# Patient Record
Sex: Male | Born: 1975 | Race: White | Hispanic: No | Marital: Married | Smoking: Former smoker
Health system: Southern US, Community
[De-identification: ages and names within clinical notes are randomized; demographics above are authoritative.]

## PROBLEM LIST (undated history)

## (undated) DIAGNOSIS — R519 Headache, unspecified: Secondary | ICD-10-CM

## (undated) DIAGNOSIS — J309 Allergic rhinitis, unspecified: Secondary | ICD-10-CM

## (undated) DIAGNOSIS — R51 Headache: Secondary | ICD-10-CM

## (undated) DIAGNOSIS — Z87898 Personal history of other specified conditions: Secondary | ICD-10-CM

## (undated) DIAGNOSIS — E039 Hypothyroidism, unspecified: Secondary | ICD-10-CM

## (undated) DIAGNOSIS — Z8679 Personal history of other diseases of the circulatory system: Secondary | ICD-10-CM

## (undated) DIAGNOSIS — I10 Essential (primary) hypertension: Secondary | ICD-10-CM

## (undated) DIAGNOSIS — K219 Gastro-esophageal reflux disease without esophagitis: Secondary | ICD-10-CM

## (undated) DIAGNOSIS — G4730 Sleep apnea, unspecified: Secondary | ICD-10-CM

## (undated) DIAGNOSIS — F419 Anxiety disorder, unspecified: Secondary | ICD-10-CM

## (undated) HISTORY — DX: Headache: R51

## (undated) HISTORY — DX: Sleep apnea, unspecified: G47.30

## (undated) HISTORY — DX: Personal history of other diseases of the circulatory system: Z86.79

## (undated) HISTORY — DX: Hypothyroidism, unspecified: E03.9

## (undated) HISTORY — PX: NASAL SINUS SURGERY: SHX719

## (undated) HISTORY — DX: Personal history of other specified conditions: Z87.898

## (undated) HISTORY — DX: Allergic rhinitis, unspecified: J30.9

## (undated) HISTORY — PX: OTHER SURGICAL HISTORY: SHX169

## (undated) HISTORY — DX: Anxiety disorder, unspecified: F41.9

## (undated) HISTORY — DX: Essential (primary) hypertension: I10

## (undated) HISTORY — DX: Headache, unspecified: R51.9

## (undated) HISTORY — DX: Gastro-esophageal reflux disease without esophagitis: K21.9

## (undated) HISTORY — PX: COLONOSCOPY: SHX174

---

## 2012-06-23 ENCOUNTER — Other Ambulatory Visit (HOSPITAL_COMMUNITY): Payer: Self-pay | Admitting: Cardiology

## 2012-06-23 DIAGNOSIS — R002 Palpitations: Secondary | ICD-10-CM

## 2012-06-23 DIAGNOSIS — R0602 Shortness of breath: Secondary | ICD-10-CM

## 2012-06-26 ENCOUNTER — Ambulatory Visit (HOSPITAL_COMMUNITY)
Admission: RE | Admit: 2012-06-26 | Discharge: 2012-06-26 | Disposition: A | Discharge status: Home / Self Care | Payer: BC Managed Care – PPO | Source: Ambulatory Visit | Attending: Cardiology | Admitting: Cardiology

## 2012-06-26 DIAGNOSIS — I059 Rheumatic mitral valve disease, unspecified: Secondary | ICD-10-CM | POA: Insufficient documentation

## 2012-06-26 DIAGNOSIS — R002 Palpitations: Secondary | ICD-10-CM | POA: Insufficient documentation

## 2012-06-26 DIAGNOSIS — R0602 Shortness of breath: Secondary | ICD-10-CM | POA: Insufficient documentation

## 2012-06-26 DIAGNOSIS — I10 Essential (primary) hypertension: Secondary | ICD-10-CM | POA: Insufficient documentation

## 2012-06-26 DIAGNOSIS — I379 Nonrheumatic pulmonary valve disorder, unspecified: Secondary | ICD-10-CM | POA: Insufficient documentation

## 2012-06-26 DIAGNOSIS — I517 Cardiomegaly: Secondary | ICD-10-CM | POA: Insufficient documentation

## 2012-06-26 DIAGNOSIS — I079 Rheumatic tricuspid valve disease, unspecified: Secondary | ICD-10-CM | POA: Insufficient documentation

## 2012-06-26 NOTE — Progress Notes (Signed)
Matthew Lawrence   2D echo completed 06/26/2012.   Cindy Precious Gilchrest, RDCS  

## 2013-10-01 ENCOUNTER — Encounter (HOSPITAL_COMMUNITY): Payer: Self-pay | Admitting: Emergency Medicine

## 2013-10-01 ENCOUNTER — Emergency Department (HOSPITAL_COMMUNITY)
Admission: EM | Admit: 2013-10-01 | Discharge: 2013-10-01 | Disposition: A | Discharge status: Home / Self Care | Payer: No Typology Code available for payment source | Attending: Emergency Medicine | Admitting: Emergency Medicine

## 2013-10-01 DIAGNOSIS — Y9389 Activity, other specified: Secondary | ICD-10-CM | POA: Insufficient documentation

## 2013-10-01 DIAGNOSIS — F17200 Nicotine dependence, unspecified, uncomplicated: Secondary | ICD-10-CM | POA: Insufficient documentation

## 2013-10-01 DIAGNOSIS — S199XXA Unspecified injury of neck, initial encounter: Secondary | ICD-10-CM

## 2013-10-01 DIAGNOSIS — M549 Dorsalgia, unspecified: Secondary | ICD-10-CM

## 2013-10-01 DIAGNOSIS — Y92410 Unspecified street and highway as the place of occurrence of the external cause: Secondary | ICD-10-CM | POA: Insufficient documentation

## 2013-10-01 DIAGNOSIS — IMO0002 Reserved for concepts with insufficient information to code with codable children: Secondary | ICD-10-CM | POA: Insufficient documentation

## 2013-10-01 DIAGNOSIS — S0993XA Unspecified injury of face, initial encounter: Secondary | ICD-10-CM | POA: Insufficient documentation

## 2013-10-01 MED ORDER — METHOCARBAMOL 500 MG PO TABS: 500.0000 mg | ORAL_TABLET | Freq: Two times a day (BID) | ORAL | Status: DC

## 2013-10-01 MED ORDER — OXYCODONE-ACETAMINOPHEN 5-325 MG PO TABS: 1.0000 | ORAL_TABLET | ORAL | Status: DC | PRN
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
## 2013-10-01 NOTE — Discharge Instructions (Signed)
Take the prescribed medication as directed.  Recommend taking a dose before bed tonight. You will continue to be sore for the next several days which is expected. Return to the ED for new or worsening symptoms.

## 2013-10-01 NOTE — ED Provider Notes (Signed)
Medical screening examination/treatment/procedure(s) were performed by non-physician practitioner and as supervising physician I was immediately available for consultation/collaboration.   EKG Interpretation None       Martha K Linker, MD 10/01/13 1135 

## 2014-05-11 ENCOUNTER — Other Ambulatory Visit: Payer: Self-pay | Admitting: Family Medicine

## 2014-05-11 DIAGNOSIS — R1011 Right upper quadrant pain: Secondary | ICD-10-CM

## 2014-05-11 DIAGNOSIS — E0789 Other specified disorders of thyroid: Secondary | ICD-10-CM

## 2014-05-18 ENCOUNTER — Ambulatory Visit
Admission: RE | Admit: 2014-05-18 | Discharge: 2014-05-18 | Disposition: A | Discharge status: Home / Self Care | Payer: 59 | Source: Ambulatory Visit | Attending: Family Medicine | Admitting: Family Medicine

## 2014-05-18 ENCOUNTER — Ambulatory Visit
Admission: RE | Admit: 2014-05-18 | Discharge: 2014-05-18 | Disposition: A | Discharge status: Home / Self Care | Payer: Self-pay | Source: Ambulatory Visit | Attending: Family Medicine | Admitting: Family Medicine

## 2014-05-18 DIAGNOSIS — E0789 Other specified disorders of thyroid: Secondary | ICD-10-CM

## 2014-05-18 DIAGNOSIS — R1011 Right upper quadrant pain: Secondary | ICD-10-CM

## 2014-05-27 ENCOUNTER — Other Ambulatory Visit: Payer: Self-pay | Admitting: Family Medicine

## 2014-05-27 DIAGNOSIS — R9389 Abnormal findings on diagnostic imaging of other specified body structures: Secondary | ICD-10-CM

## 2014-06-25 ENCOUNTER — Other Ambulatory Visit: Payer: 59

## 2014-07-11 ENCOUNTER — Ambulatory Visit
Admission: RE | Admit: 2014-07-11 | Discharge: 2014-07-11 | Disposition: A | Discharge status: Home / Self Care | Payer: 59 | Source: Ambulatory Visit | Attending: Family Medicine | Admitting: Family Medicine

## 2014-07-11 DIAGNOSIS — R9389 Abnormal findings on diagnostic imaging of other specified body structures: Secondary | ICD-10-CM

## 2014-07-11 MED ORDER — GADOBENATE DIMEGLUMINE 529 MG/ML IV SOLN
15.0000 mL | Freq: Once | INTRAVENOUS | Status: AC | PRN
  Administered 2014-07-11: 15 mL via INTRAVENOUS

## 2014-09-02 ENCOUNTER — Encounter: Payer: Self-pay | Admitting: *Deleted

## 2014-10-04 ENCOUNTER — Encounter: Payer: Self-pay | Admitting: Cardiology

## 2015-06-28 ENCOUNTER — Other Ambulatory Visit: Payer: Self-pay | Admitting: Neurology

## 2015-06-28 ENCOUNTER — Encounter: Payer: Self-pay | Admitting: Neurology

## 2015-06-28 ENCOUNTER — Ambulatory Visit (INDEPENDENT_AMBULATORY_CARE_PROVIDER_SITE_OTHER): Payer: Commercial Managed Care - HMO | Admitting: Neurology

## 2015-06-28 VITALS — BP 126/81 | HR 84 | Ht 67.25 in | Wt 182.0 lb

## 2015-06-28 DIAGNOSIS — M2669 Other specified disorders of temporomandibular joint: Secondary | ICD-10-CM

## 2015-06-28 DIAGNOSIS — R51 Headache: Secondary | ICD-10-CM | POA: Diagnosis not present

## 2015-06-28 DIAGNOSIS — M26649 Arthritis of unspecified temporomandibular joint: Secondary | ICD-10-CM | POA: Insufficient documentation

## 2015-06-28 DIAGNOSIS — R519 Headache, unspecified: Secondary | ICD-10-CM | POA: Insufficient documentation

## 2015-06-28 MED ORDER — GABAPENTIN 300 MG PO CAPS: 300.0000 mg | ORAL_CAPSULE | Freq: Three times a day (TID) | ORAL | Status: DC

## 2015-06-28 MED ORDER — MELOXICAM 7.5 MG PO TABS: 7.5000 mg | ORAL_TABLET | Freq: Two times a day (BID) | ORAL | Status: AC | PRN

## 2015-06-28 NOTE — Progress Notes (Signed)
PATIENT: Matthew Lawrence DOB: 04/22/1975  Chief Complaint  Patient presents with  . Possible Trigeminal Neuralgia    He is here with his fiance, Matthew Lawrence.  States he has experienced intermittent left-sided facial pain over the last several years.  His most recent flare-up has been the worst to date.  He is currently using cyclobenzaprine 10mg  at bedtime, which helps him sleep.  He also uses ibuprofen as needed.     HISTORICAL  Matthew Lawrence is a 40 years old right-handed male, accompanied by his fiance Matthew Lawrence, seen in refer by his primary care physician  Matthew Lawrence 40 evaluation of left facial pain in Jun 28 2015  He noticed intermittent left TMG/left facial pain since 2010, it only happened once while, but since April 2017, he has frequent flare of severe left TMG left facial pain, he described left lower and upper molar area deep achy sensation, then he also noticed left external ear canal sharp radiating pain, worsening pain when he opened his mouth, he also noticed left the skull hypersensitivity to touch, left facial swelling, severe pain can last 10-30 minutes, during episode, he often have to avoid talking open his mouth to ease the pain,  He did have a history of chickenpox in the past, there was no rash broke out, no hearing loss, he had intermittent bilateral ear high pitched tinnitus, was evaluated by ENT in the past, was diagnosed with high-frequency hearing loss.  He reported a history of anaphylactic reaction with gadolinium MRI contrast in May 2016  REVIEW OF SYSTEMS: Full 14 system review of systems performed and notable only for Ringing ears, snoring, joint pain, joint swelling, achy muscles, allergy, runny nose, skin sensitivity, headaches, snoring   ALLERGIES: Allergies  Allergen Reactions  . Gadolinium Derivatives Nausea Only, Other (See Comments) and Cough    Blotchy behind ears, sneezing and extremely stuffy. Chest pain. Given 50 mg benadryl by mouth and  observed.     HOME MEDICATIONS: Current Outpatient Prescriptions  Medication Sig Dispense Refill  . clonazePAM (KLONOPIN) 0.5 MG tablet Take 0.5 mg by mouth as needed for anxiety.    . cyclobenzaprine (FLEXERIL) 10 MG tablet TK 1 T PO QHS PRN FOR SPASMS  0  . ibuprofen (ADVIL,MOTRIN) 200 MG tablet Take 400 mg by mouth every 6 (six) hours as needed for mild pain or moderate pain.    Marland Kitchen. levothyroxine (SYNTHROID, LEVOTHROID) 100 MCG tablet TK 1 T PO  QD  4  . loratadine (CLARITIN) 10 MG tablet Take 10 mg by mouth daily as needed.      No current facility-administered medications for this visit.    PAST MEDICAL HISTORY: Past Medical History  Diagnosis Date  . H/O chest pain     Cardionet, Palps  . H/O sinus tachycardia     Cardionet  . Hypertension   . Hypothyroidism   . GERD (gastroesophageal reflux disease)   . Sleep apnea     He does not use a CPAP machine.  . Allergic rhinitis   . Anxiety   . Facial pain     left-side    PAST SURGICAL HISTORY: Past Surgical History  Procedure Laterality Date  . None      FAMILY HISTORY: Family History  Problem Relation Age of Onset  . Cancer Maternal Grandfather   . Emphysema Paternal Grandfather   . Healthy Mother   . Healthy Father     SOCIAL HISTORY:  Social History   Social History  . Marital  Status: Married    Spouse Name: N/A  . Number of Children: 1  . Years of Education: Bachelors+   Occupational History  . [employer]    Social History Main Topics  . Smoking status: Current Every Day Smoker    Types: E-cigarettes  . Smokeless tobacco: Not on file     Comment: Quit smoking cigarettes 10+ years ago.  . Alcohol Use: 0.0 oz/week    0 Standard drinks or equivalent per week     Comment: Occasional use.  . Drug Use: 1.00 per week    Special: Marijuana     Comment: Occasional use.  Marland Kitchen Sexual Activity: Not on file   Other Topics Concern  . Not on file   Social History Narrative   Lives at home with  fiance, Matthew Lawrence.   Right-handed.   2 cups caffeine use.     PHYSICAL EXAM   Filed Vitals:   06/28/15 1152  BP: 126/81  Pulse: 84  Height: 5' 7.25" (1.708 m)  Weight: 182 lb (82.555 kg)    Not recorded      Body mass index is 28.3 kg/(m^2).  PHYSICAL EXAMNIATION:  Gen: NAD, conversant, well nourised, obese, well groomed                     Cardiovascular: Regular rate rhythm, no peripheral edema, warm, nontender. Eyes: Conjunctivae clear without exudates or hemorrhage Neck: Supple, no carotid bruise. Pulmonary: Clear to auscultation bilaterally  Musculoskeletal: Tenderness of left TMJ joint with mouth opening and closure,  NEUROLOGICAL EXAM:  MENTAL STATUS: Speech:    Speech is normal; fluent and spontaneous with normal comprehension.  Cognition:     Orientation to time, place and person     Normal recent and remote memory     Normal Attention span and concentration     Normal Language, naming, repeating,spontaneous speech     Fund of knowledge   CRANIAL NERVES: CN II: Visual fields are full to confrontation. Fundoscopic exam is normal with sharp discs and no vascular changes. Pupils are round equal and briskly reactive to light. CN III, IV, VI: extraocular movement are normal. No ptosis. CN V: Facial sensation is intact to pinprick in all 3 divisions bilaterally. Corneal responses are intact.  CN VII: Face is symmetric with normal eye closure and smile. CN VIII: Hearing is normal to rubbing fingers CN IX, X: Palate elevates symmetrically. Phonation is normal. CN XI: Head turning and shoulder shrug are intact CN XII: Tongue is midline with normal movements and no atrophy.  MOTOR: There is no pronator drift of out-stretched arms. Muscle bulk and tone are normal. Muscle strength is normal.  REFLEXES: Reflexes are 2+ and symmetric at the biceps, triceps, knees, and ankles. Plantar responses are flexor.  SENSORY: Intact to light touch, pinprick, positional  sensation and vibratory sensation are intact in fingers and toes.  COORDINATION: Rapid alternating movements and fine finger movements are intact. There is no dysmetria on finger-to-nose and heel-knee-shin.    GAIT/STANCE: Posture is normal. Gait is steady with normal steps, base, arm swing, and turning. Heel and toe walking are normal. Tandem gait is normal.  Romberg is absent.   DIAGNOSTIC DATA (LABS, IMAGING, TESTING) - I reviewed patient records, labs, notes, testing and imaging myself where available.   ASSESSMENT AND PLAN  Matthew Lawrence is a 40 y.o. 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 Ph.D.  [facility] [address] [place], [place] [address] Ph: [phone] Fax: [phone]  CC: Matthew Has, MD

## 2016-12-21 IMAGING — US US SOFT TISSUE HEAD/NECK
1 series · 14 of 25 positions shown · non-contrast
Comparison: None.

CLINICAL DATA: Evaluate thyroid fullness.

EXAM:
THYROID ULTRASOUND
TECHNIQUE: Ultrasound examination of the thyroid gland and adjacent soft
tissues was performed.

[Series 1: us soft tissue head/neck · 0.05mm/px · 14 of 48 slices shown]
[im 1/48]
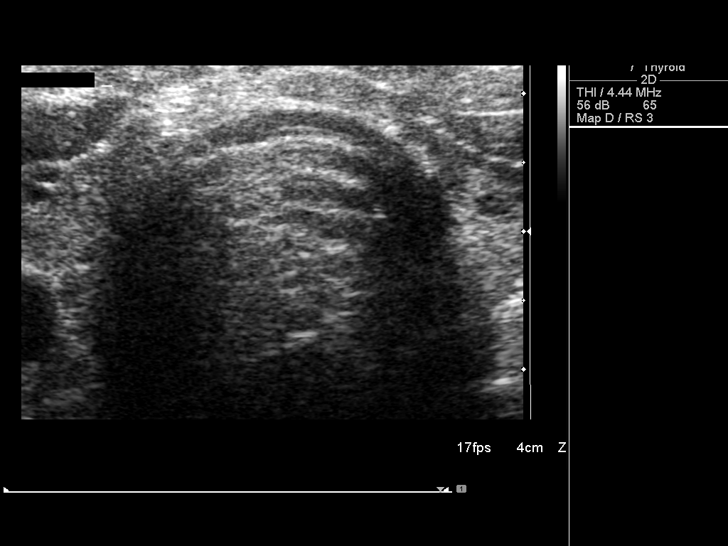
[im 4/48]
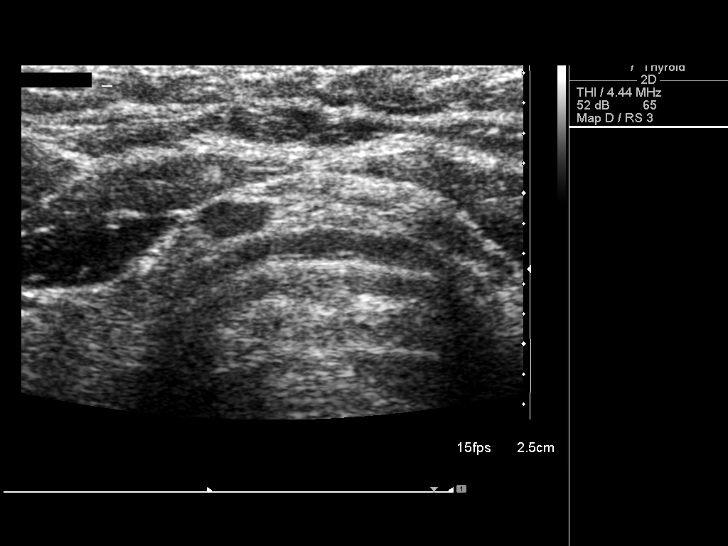
[im 8/48]
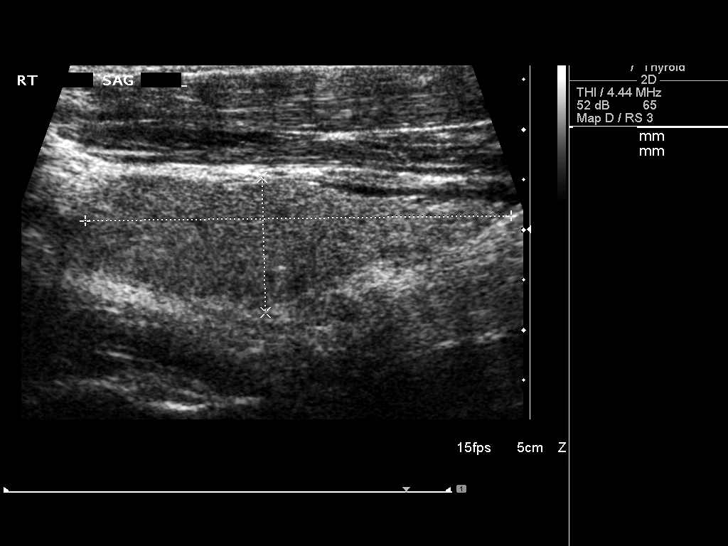
[im 12/48]
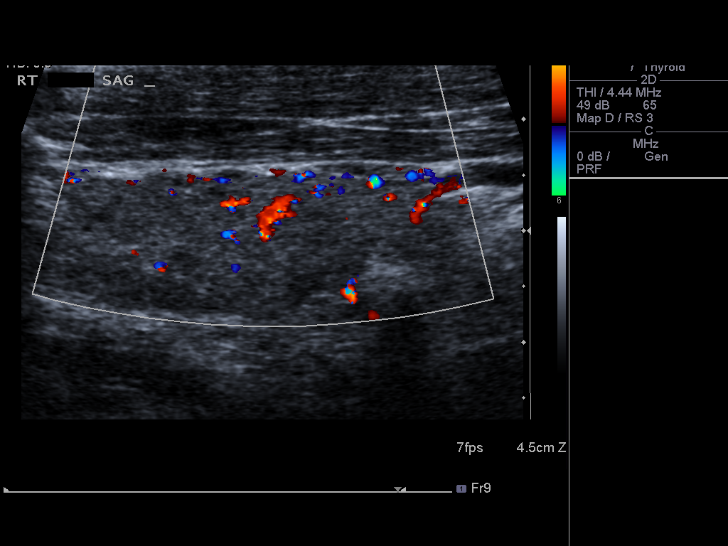
[im 16/48]
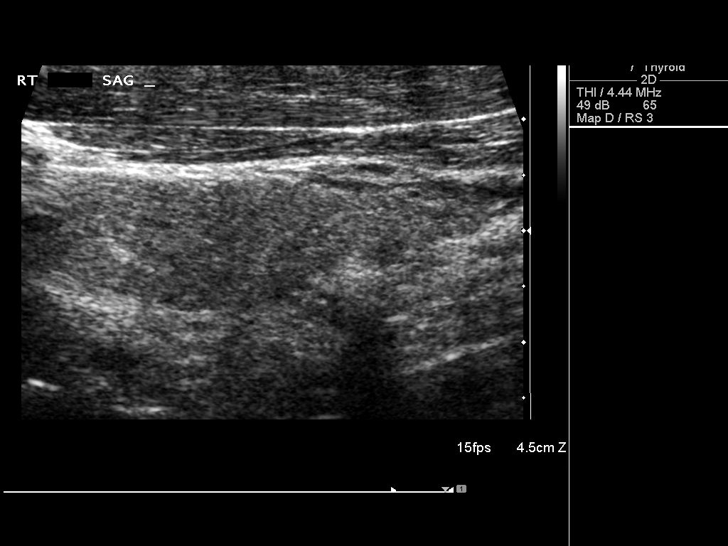
[im 18/48]
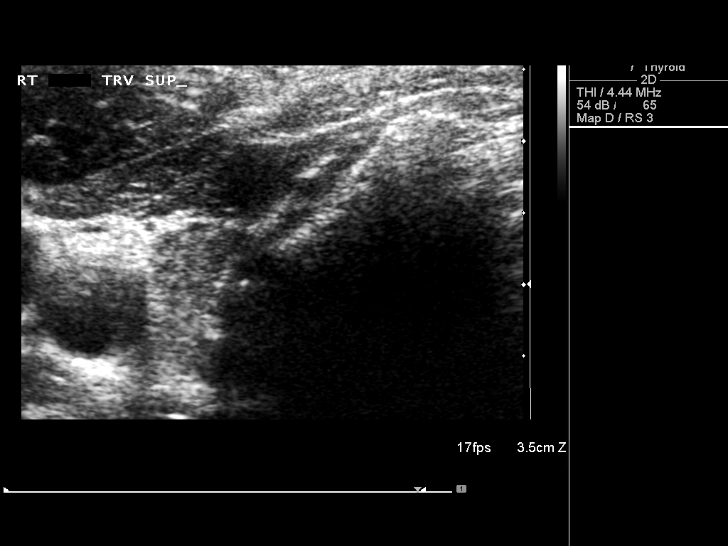
[im 22/48]
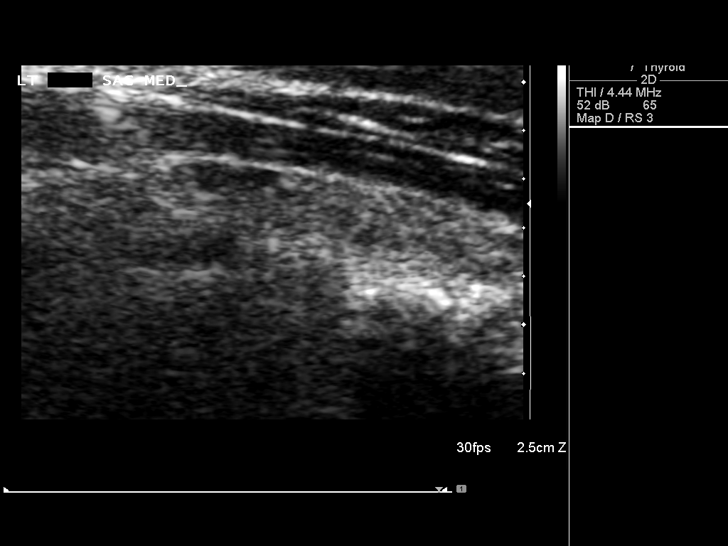
[im 26/48]
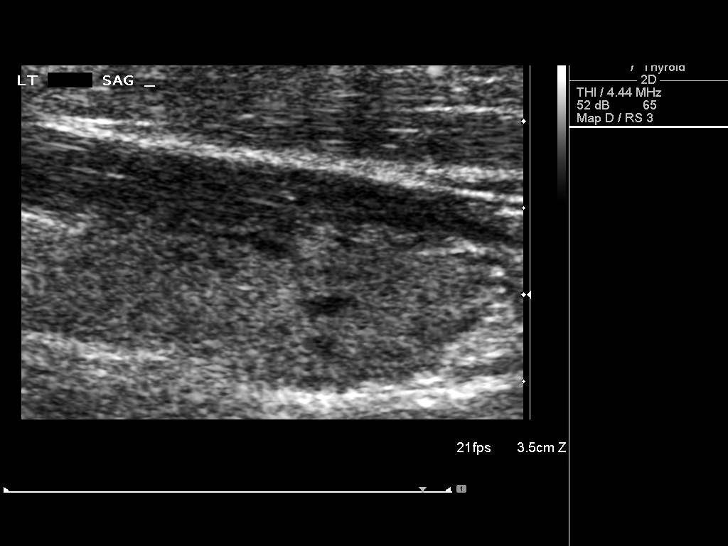
[im 30/48]
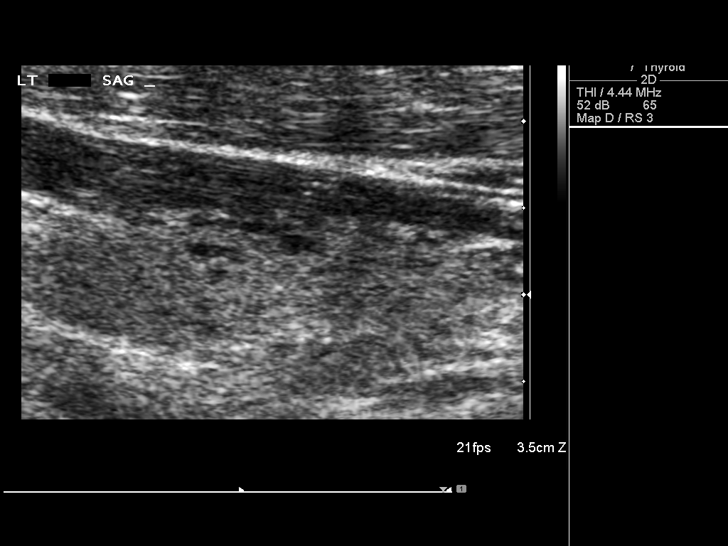
[im 32/48]
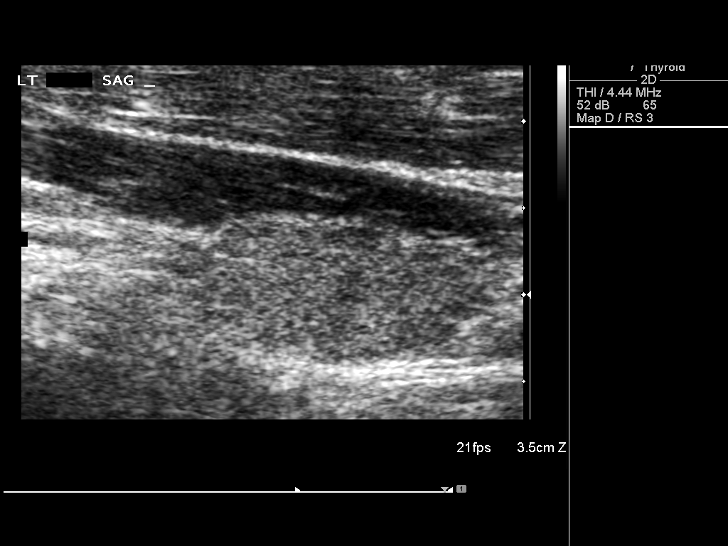
[im 36/48]
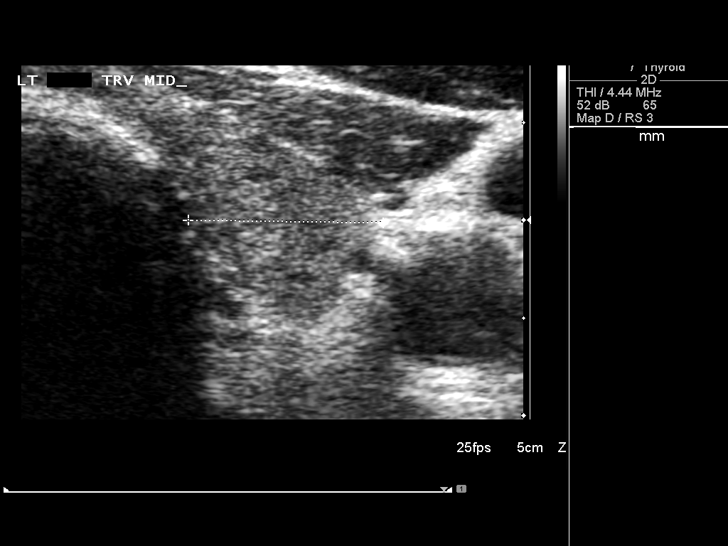
[im 40/48]
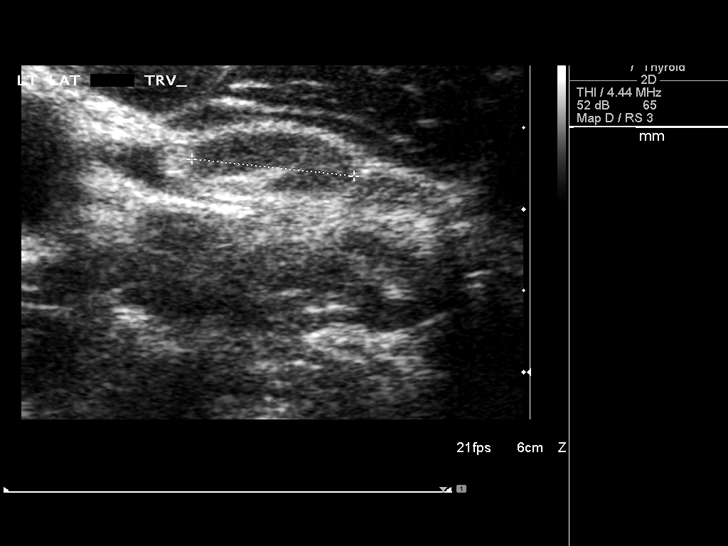
[im 44/48]
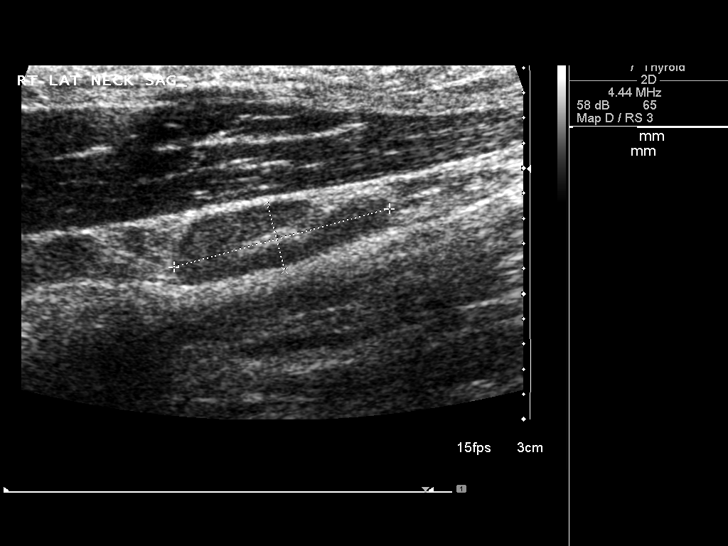
[im 48/48]
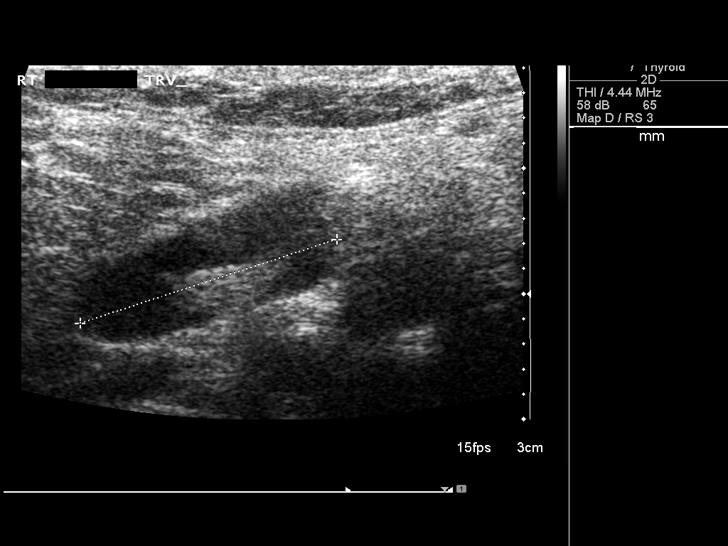

[14 of 25 positions shown; findings below may reference images not displayed]

FINDINGS: Right thyroid lobe

Measurements: 4.3 x 1.4 x 1.0 cm. Thyroid tissue is mildly
heterogeneous without a focal nodule.

Left thyroid lobe

Measurements: 3.0 x 1.0 x 1.0 cm. Thyroid tissue is heterogeneous.
Questionable two small hypoechoic nodules in the left thyroid lobe,
largest measuring up to 0.3 cm.

Isthmus

Thickness: 0.3 cm. Hypoechoic nodule along the right side of the
isthmus, measures up to 0.5 cm.

Lymphadenopathy

Prominent but normal appearing lymph nodes in the neck. Largest
lymph node is on the right side measuring 0.8 cm in the short axis.
IMPRESSION: Small thyroid nodules. These thyroid nodules do not meet the
criteria for biopsy. This recommendation follows the consensus
statement: Management of Thyroid Nodules Detected at US: Society of
Radiologists in Ultrasound Consensus Conference Statement. Radiology
8882; [DATE].

Prominent but normal appearing lymph nodes in the neck.

## 2017-02-11 ENCOUNTER — Other Ambulatory Visit: Payer: Self-pay | Admitting: Family Medicine

## 2017-02-11 DIAGNOSIS — N50819 Testicular pain, unspecified: Secondary | ICD-10-CM

## 2017-02-25 ENCOUNTER — Ambulatory Visit
Admission: RE | Admit: 2017-02-25 | Discharge: 2017-02-25 | Disposition: A | Discharge status: Home / Self Care | Payer: No Typology Code available for payment source | Source: Ambulatory Visit | Attending: Family Medicine | Admitting: Family Medicine

## 2017-02-25 DIAGNOSIS — N50819 Testicular pain, unspecified: Secondary | ICD-10-CM

## 2018-11-23 IMAGING — US US SCROTUM W/ DOPPLER COMPLETE
1 series · 14 of 25 positions shown · non-contrast
Comparison: None.

CLINICAL DATA: Intermittent left testicular pain for 4 months

EXAM:
SCROTAL ULTRASOUND
DOPPLER ULTRASOUND OF THE TESTICLES
TECHNIQUE: Complete ultrasound examination of the testicles, epididymis, and
other scrotal structures was performed. Color and spectral Doppler
ultrasound were also utilized to evaluate blood flow to the
testicles.

[Series 1: us scrotum w/ doppler complete · 0.05mm/px · 14 of 66 slices shown]
[im 1/66]
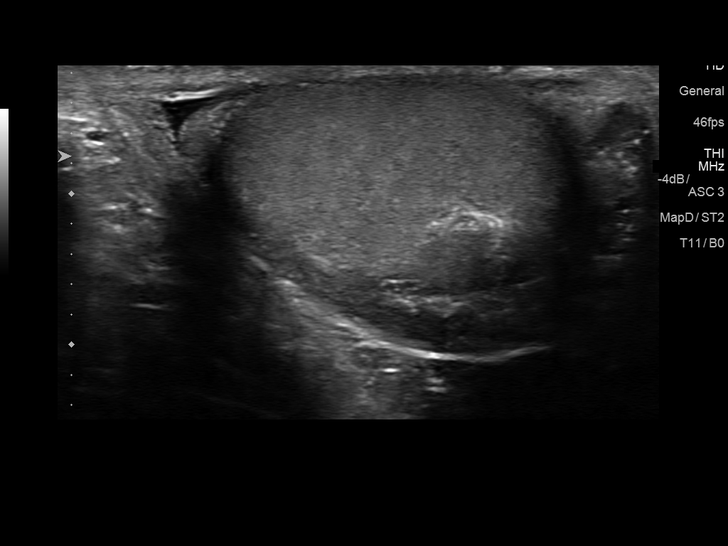
[im 6/66]
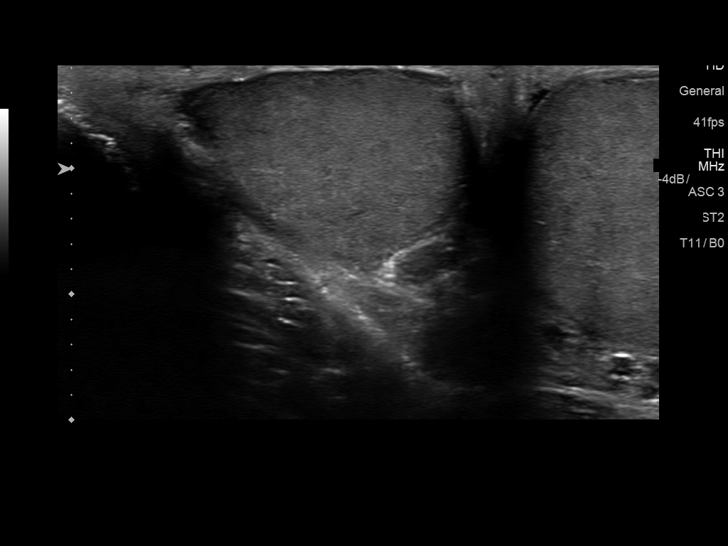
[im 11/66]
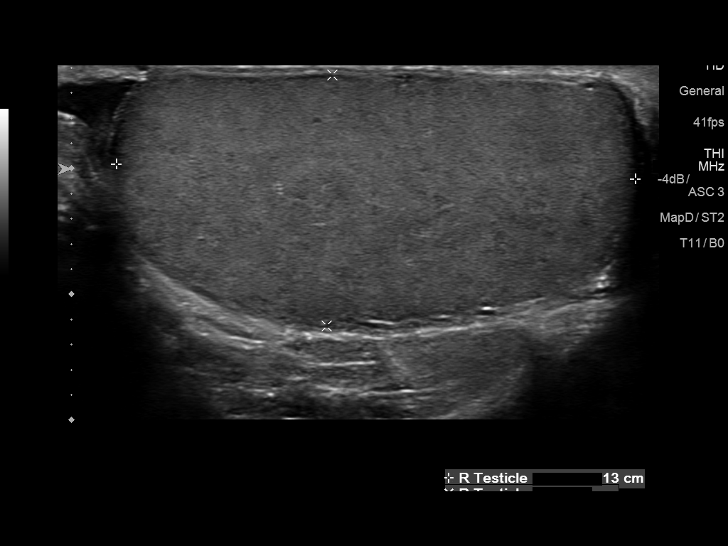
[im 17/66]
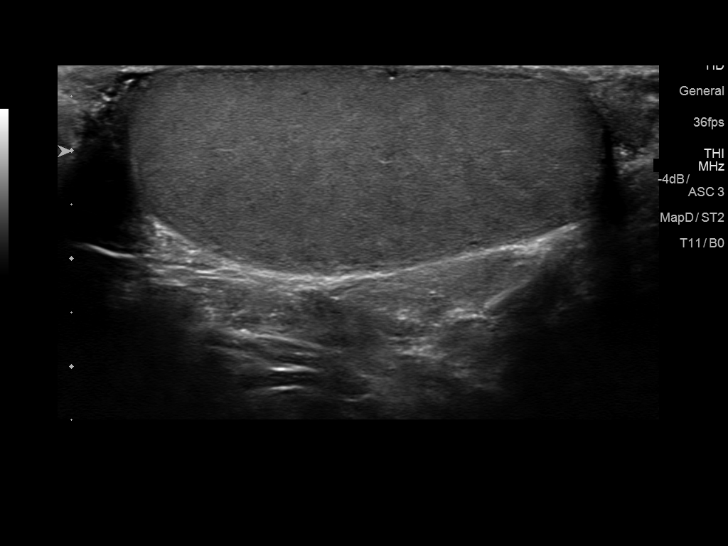
[im 22/66]
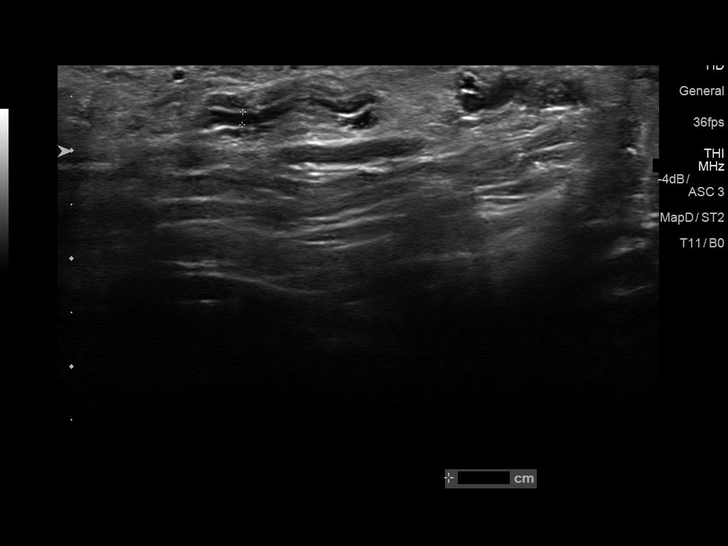
[im 25/66]
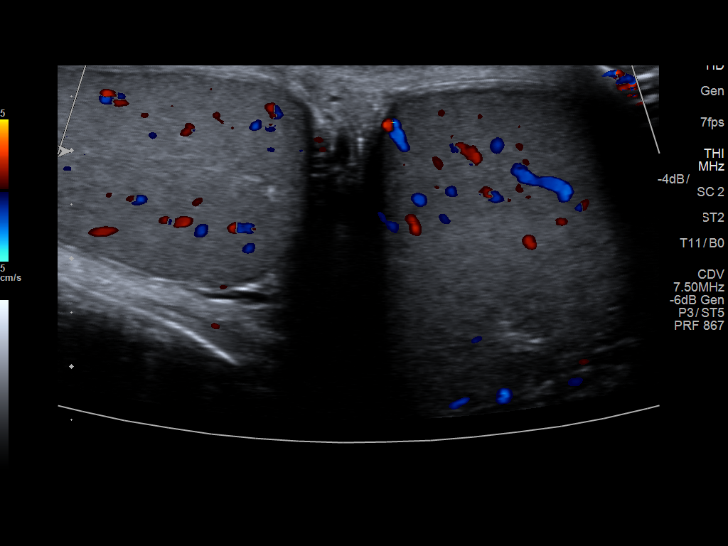
[im 30/66]
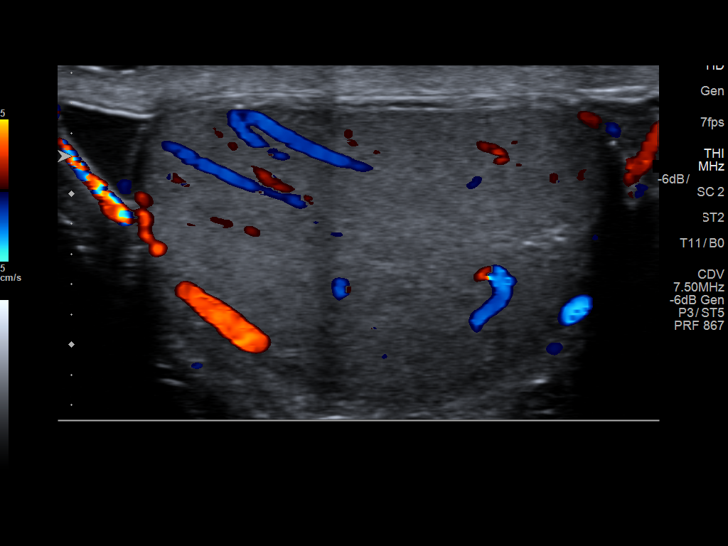
[im 36/66]
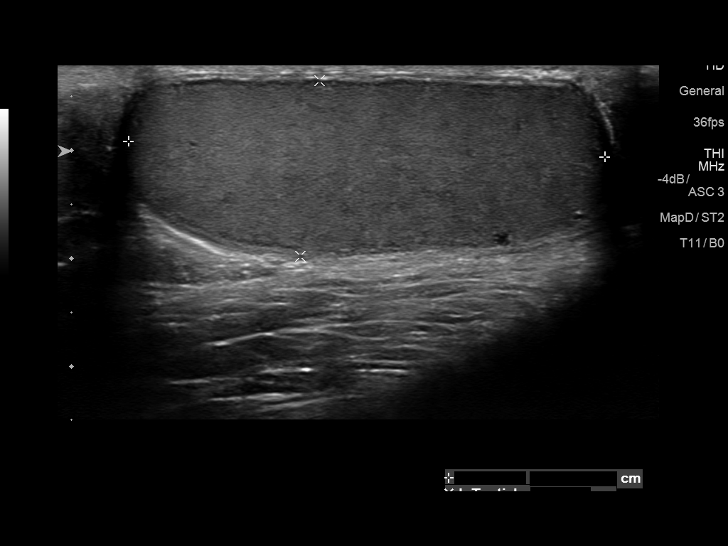
[im 41/66]
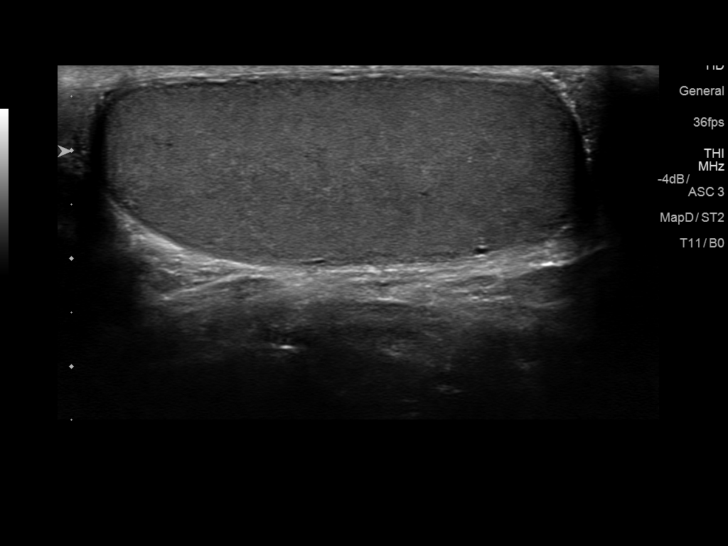
[im 44/66]
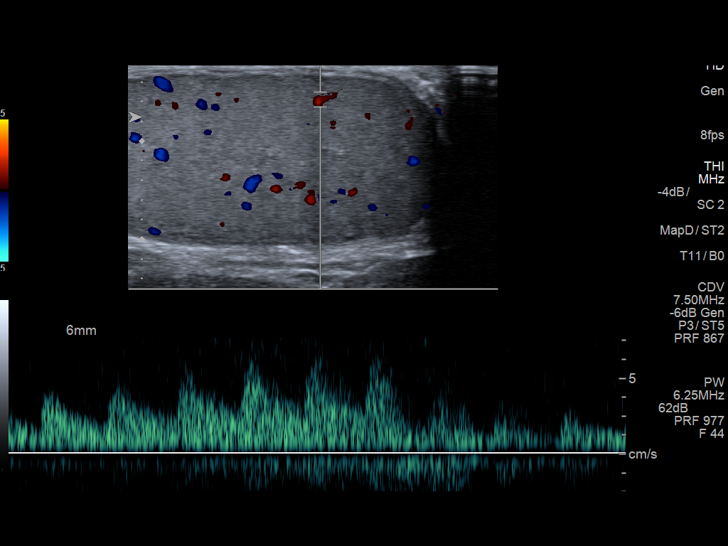
[im 49/66]
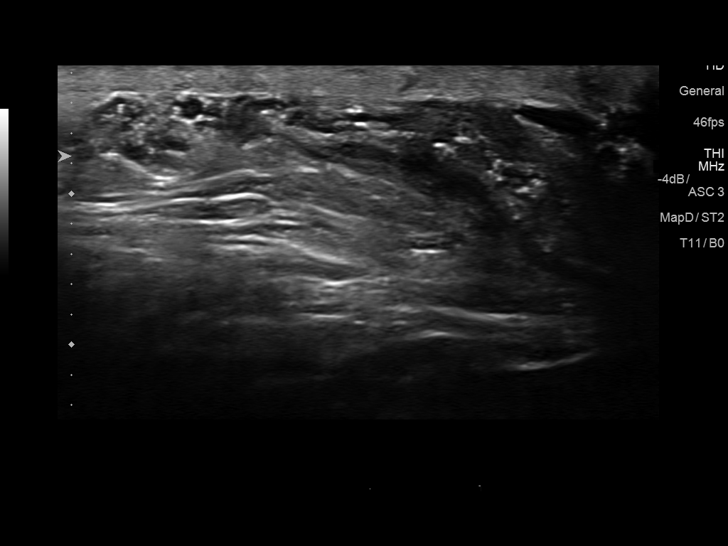
[im 55/66]
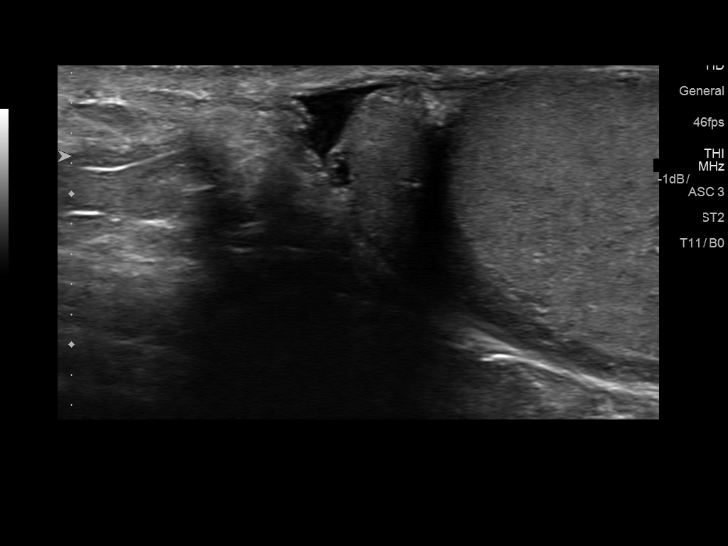
[im 60/66]
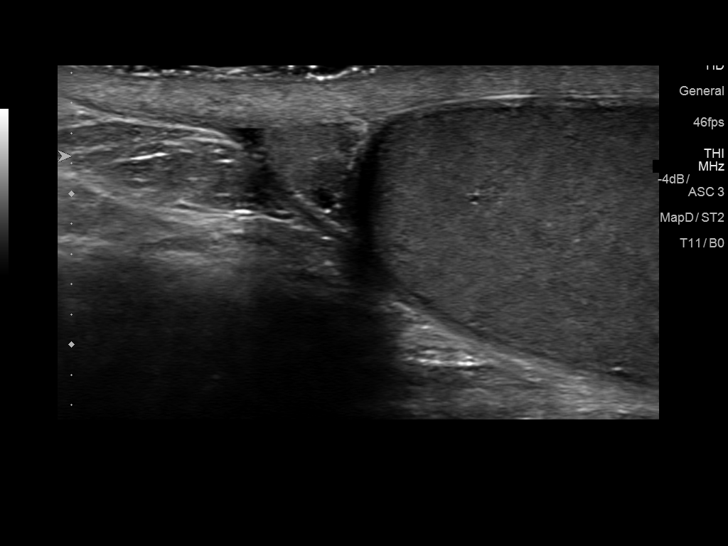
[im 66/66]
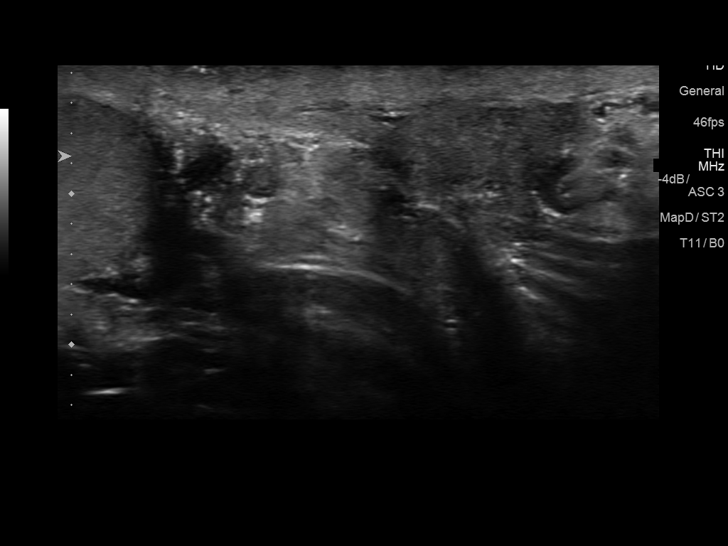

[14 of 25 positions shown; findings below may reference images not displayed]

FINDINGS: Right testicle

Measurements: 4.1 x 2.0 x 3.3 cm. No mass or microlithiasis
visualized.

Left testicle

Measurements: 4.4 x 1.6 x 3.2 cm. No mass or microlithiasis
visualized.

Right epididymis: Incidental tiny epididymal head cyst measuring 1-2
mm.

Left epididymis: Similar incidental epididymal head cysts (2). These
measure 3 mm or less in size.

Hydrocele:  None visualized.

Varicocele:  None visualized.

Pulsed Doppler interrogation of both testes demonstrates normal low
resistance arterial and venous waveforms bilaterally.
IMPRESSION: No acute or significant finding by ultrasound.  Normal Doppler.

## 2019-09-29 DIAGNOSIS — J339 Nasal polyp, unspecified: Secondary | ICD-10-CM | POA: Diagnosis not present

## 2019-09-29 DIAGNOSIS — J309 Allergic rhinitis, unspecified: Secondary | ICD-10-CM | POA: Diagnosis not present

## 2019-09-29 DIAGNOSIS — J329 Chronic sinusitis, unspecified: Secondary | ICD-10-CM | POA: Diagnosis not present

## 2019-10-20 DIAGNOSIS — H903 Sensorineural hearing loss, bilateral: Secondary | ICD-10-CM | POA: Diagnosis not present

## 2019-11-10 DIAGNOSIS — J301 Allergic rhinitis due to pollen: Secondary | ICD-10-CM | POA: Diagnosis not present

## 2019-11-13 DIAGNOSIS — J301 Allergic rhinitis due to pollen: Secondary | ICD-10-CM | POA: Diagnosis not present

## 2019-11-17 DIAGNOSIS — J339 Nasal polyp, unspecified: Secondary | ICD-10-CM | POA: Diagnosis not present

## 2019-11-17 DIAGNOSIS — J329 Chronic sinusitis, unspecified: Secondary | ICD-10-CM | POA: Diagnosis not present

## 2019-11-17 DIAGNOSIS — J309 Allergic rhinitis, unspecified: Secondary | ICD-10-CM | POA: Diagnosis not present

## 2019-11-26 DIAGNOSIS — J301 Allergic rhinitis due to pollen: Secondary | ICD-10-CM | POA: Diagnosis not present

## 2019-12-03 DIAGNOSIS — J301 Allergic rhinitis due to pollen: Secondary | ICD-10-CM | POA: Diagnosis not present

## 2019-12-07 DIAGNOSIS — J301 Allergic rhinitis due to pollen: Secondary | ICD-10-CM | POA: Diagnosis not present

## 2019-12-08 DIAGNOSIS — J339 Nasal polyp, unspecified: Secondary | ICD-10-CM | POA: Diagnosis not present

## 2019-12-08 DIAGNOSIS — J019 Acute sinusitis, unspecified: Secondary | ICD-10-CM | POA: Diagnosis not present

## 2019-12-10 DIAGNOSIS — J301 Allergic rhinitis due to pollen: Secondary | ICD-10-CM | POA: Diagnosis not present

## 2019-12-11 DIAGNOSIS — J301 Allergic rhinitis due to pollen: Secondary | ICD-10-CM | POA: Diagnosis not present

## 2019-12-14 DIAGNOSIS — J301 Allergic rhinitis due to pollen: Secondary | ICD-10-CM | POA: Diagnosis not present

## 2019-12-17 DIAGNOSIS — J301 Allergic rhinitis due to pollen: Secondary | ICD-10-CM | POA: Diagnosis not present

## 2019-12-21 DIAGNOSIS — J329 Chronic sinusitis, unspecified: Secondary | ICD-10-CM | POA: Diagnosis not present

## 2019-12-21 DIAGNOSIS — J339 Nasal polyp, unspecified: Secondary | ICD-10-CM | POA: Diagnosis not present

## 2019-12-21 DIAGNOSIS — J301 Allergic rhinitis due to pollen: Secondary | ICD-10-CM | POA: Diagnosis not present

## 2019-12-24 DIAGNOSIS — J301 Allergic rhinitis due to pollen: Secondary | ICD-10-CM | POA: Diagnosis not present

## 2019-12-28 DIAGNOSIS — J301 Allergic rhinitis due to pollen: Secondary | ICD-10-CM | POA: Diagnosis not present

## 2019-12-31 DIAGNOSIS — J301 Allergic rhinitis due to pollen: Secondary | ICD-10-CM | POA: Diagnosis not present

## 2020-01-01 DIAGNOSIS — J301 Allergic rhinitis due to pollen: Secondary | ICD-10-CM | POA: Diagnosis not present

## 2020-01-11 DIAGNOSIS — J301 Allergic rhinitis due to pollen: Secondary | ICD-10-CM | POA: Diagnosis not present

## 2020-01-14 DIAGNOSIS — J301 Allergic rhinitis due to pollen: Secondary | ICD-10-CM | POA: Diagnosis not present

## 2020-01-18 DIAGNOSIS — J339 Nasal polyp, unspecified: Secondary | ICD-10-CM | POA: Diagnosis not present

## 2020-01-18 DIAGNOSIS — T7840XA Allergy, unspecified, initial encounter: Secondary | ICD-10-CM | POA: Diagnosis not present

## 2020-01-18 DIAGNOSIS — J301 Allergic rhinitis due to pollen: Secondary | ICD-10-CM | POA: Diagnosis not present

## 2020-01-19 DIAGNOSIS — Z125 Encounter for screening for malignant neoplasm of prostate: Secondary | ICD-10-CM | POA: Diagnosis not present

## 2020-01-19 DIAGNOSIS — E785 Hyperlipidemia, unspecified: Secondary | ICD-10-CM | POA: Diagnosis not present

## 2020-01-19 DIAGNOSIS — E039 Hypothyroidism, unspecified: Secondary | ICD-10-CM | POA: Diagnosis not present

## 2020-01-19 DIAGNOSIS — Z0000 Encounter for general adult medical examination without abnormal findings: Secondary | ICD-10-CM | POA: Diagnosis not present

## 2020-01-21 DIAGNOSIS — J301 Allergic rhinitis due to pollen: Secondary | ICD-10-CM | POA: Diagnosis not present

## 2020-01-25 DIAGNOSIS — J301 Allergic rhinitis due to pollen: Secondary | ICD-10-CM | POA: Diagnosis not present

## 2020-01-28 DIAGNOSIS — J301 Allergic rhinitis due to pollen: Secondary | ICD-10-CM | POA: Diagnosis not present

## 2020-02-01 DIAGNOSIS — J301 Allergic rhinitis due to pollen: Secondary | ICD-10-CM | POA: Diagnosis not present

## 2020-02-11 DIAGNOSIS — J301 Allergic rhinitis due to pollen: Secondary | ICD-10-CM | POA: Diagnosis not present

## 2020-02-15 DIAGNOSIS — J301 Allergic rhinitis due to pollen: Secondary | ICD-10-CM | POA: Diagnosis not present

## 2020-02-18 DIAGNOSIS — J301 Allergic rhinitis due to pollen: Secondary | ICD-10-CM | POA: Diagnosis not present

## 2020-02-22 DIAGNOSIS — J301 Allergic rhinitis due to pollen: Secondary | ICD-10-CM | POA: Diagnosis not present

## 2020-02-25 DIAGNOSIS — J301 Allergic rhinitis due to pollen: Secondary | ICD-10-CM | POA: Diagnosis not present

## 2020-03-07 DIAGNOSIS — J301 Allergic rhinitis due to pollen: Secondary | ICD-10-CM | POA: Diagnosis not present

## 2020-03-10 DIAGNOSIS — J301 Allergic rhinitis due to pollen: Secondary | ICD-10-CM | POA: Diagnosis not present

## 2020-03-14 DIAGNOSIS — J301 Allergic rhinitis due to pollen: Secondary | ICD-10-CM | POA: Diagnosis not present

## 2020-03-16 DIAGNOSIS — J309 Allergic rhinitis, unspecified: Secondary | ICD-10-CM | POA: Diagnosis not present

## 2020-03-16 DIAGNOSIS — J339 Nasal polyp, unspecified: Secondary | ICD-10-CM | POA: Diagnosis not present

## 2020-03-17 DIAGNOSIS — J301 Allergic rhinitis due to pollen: Secondary | ICD-10-CM | POA: Diagnosis not present

## 2020-03-21 DIAGNOSIS — J301 Allergic rhinitis due to pollen: Secondary | ICD-10-CM | POA: Diagnosis not present

## 2020-03-23 DIAGNOSIS — R1011 Right upper quadrant pain: Secondary | ICD-10-CM | POA: Diagnosis not present

## 2020-03-24 DIAGNOSIS — J301 Allergic rhinitis due to pollen: Secondary | ICD-10-CM | POA: Diagnosis not present

## 2020-03-28 DIAGNOSIS — J301 Allergic rhinitis due to pollen: Secondary | ICD-10-CM | POA: Diagnosis not present

## 2020-03-29 DIAGNOSIS — R1011 Right upper quadrant pain: Secondary | ICD-10-CM | POA: Diagnosis not present

## 2020-03-29 DIAGNOSIS — R0981 Nasal congestion: Secondary | ICD-10-CM | POA: Diagnosis not present

## 2020-03-29 DIAGNOSIS — J301 Allergic rhinitis due to pollen: Secondary | ICD-10-CM | POA: Diagnosis not present

## 2020-03-30 ENCOUNTER — Other Ambulatory Visit: Payer: Self-pay | Admitting: Family Medicine

## 2020-03-30 DIAGNOSIS — R1011 Right upper quadrant pain: Secondary | ICD-10-CM

## 2020-03-31 DIAGNOSIS — J301 Allergic rhinitis due to pollen: Secondary | ICD-10-CM | POA: Diagnosis not present

## 2020-04-04 DIAGNOSIS — J301 Allergic rhinitis due to pollen: Secondary | ICD-10-CM | POA: Diagnosis not present

## 2020-04-07 DIAGNOSIS — J301 Allergic rhinitis due to pollen: Secondary | ICD-10-CM | POA: Diagnosis not present

## 2020-04-11 DIAGNOSIS — J301 Allergic rhinitis due to pollen: Secondary | ICD-10-CM | POA: Diagnosis not present

## 2020-04-12 ENCOUNTER — Ambulatory Visit
Admission: RE | Admit: 2020-04-12 | Discharge: 2020-04-12 | Disposition: A | Discharge status: Home / Self Care | Payer: BC Managed Care – PPO | Source: Ambulatory Visit | Attending: Family Medicine | Admitting: Family Medicine

## 2020-04-12 ENCOUNTER — Other Ambulatory Visit: Payer: No Typology Code available for payment source

## 2020-04-12 DIAGNOSIS — R1011 Right upper quadrant pain: Secondary | ICD-10-CM

## 2020-04-14 DIAGNOSIS — J301 Allergic rhinitis due to pollen: Secondary | ICD-10-CM | POA: Diagnosis not present

## 2020-04-18 DIAGNOSIS — J301 Allergic rhinitis due to pollen: Secondary | ICD-10-CM | POA: Diagnosis not present

## 2020-04-21 DIAGNOSIS — J301 Allergic rhinitis due to pollen: Secondary | ICD-10-CM | POA: Diagnosis not present

## 2020-04-26 DIAGNOSIS — J301 Allergic rhinitis due to pollen: Secondary | ICD-10-CM | POA: Diagnosis not present

## 2020-04-28 DIAGNOSIS — J301 Allergic rhinitis due to pollen: Secondary | ICD-10-CM | POA: Diagnosis not present

## 2020-05-02 DIAGNOSIS — J301 Allergic rhinitis due to pollen: Secondary | ICD-10-CM | POA: Diagnosis not present

## 2020-05-05 DIAGNOSIS — J301 Allergic rhinitis due to pollen: Secondary | ICD-10-CM | POA: Diagnosis not present

## 2020-05-12 DIAGNOSIS — J301 Allergic rhinitis due to pollen: Secondary | ICD-10-CM | POA: Diagnosis not present

## 2020-05-17 DIAGNOSIS — J310 Chronic rhinitis: Secondary | ICD-10-CM | POA: Diagnosis not present

## 2020-05-17 DIAGNOSIS — J339 Nasal polyp, unspecified: Secondary | ICD-10-CM | POA: Diagnosis not present

## 2020-05-17 DIAGNOSIS — J324 Chronic pansinusitis: Secondary | ICD-10-CM | POA: Diagnosis not present

## 2020-05-17 DIAGNOSIS — Z6827 Body mass index (BMI) 27.0-27.9, adult: Secondary | ICD-10-CM | POA: Diagnosis not present

## 2020-05-19 DIAGNOSIS — J301 Allergic rhinitis due to pollen: Secondary | ICD-10-CM | POA: Diagnosis not present

## 2020-05-26 DIAGNOSIS — J301 Allergic rhinitis due to pollen: Secondary | ICD-10-CM | POA: Diagnosis not present

## 2020-06-02 DIAGNOSIS — J301 Allergic rhinitis due to pollen: Secondary | ICD-10-CM | POA: Diagnosis not present

## 2020-06-09 DIAGNOSIS — J301 Allergic rhinitis due to pollen: Secondary | ICD-10-CM | POA: Diagnosis not present

## 2020-06-21 DIAGNOSIS — L089 Local infection of the skin and subcutaneous tissue, unspecified: Secondary | ICD-10-CM | POA: Diagnosis not present

## 2020-06-21 DIAGNOSIS — R062 Wheezing: Secondary | ICD-10-CM | POA: Diagnosis not present

## 2020-06-21 DIAGNOSIS — S30860A Insect bite (nonvenomous) of lower back and pelvis, initial encounter: Secondary | ICD-10-CM | POA: Diagnosis not present

## 2020-06-23 DIAGNOSIS — J301 Allergic rhinitis due to pollen: Secondary | ICD-10-CM | POA: Diagnosis not present

## 2020-06-24 DIAGNOSIS — J301 Allergic rhinitis due to pollen: Secondary | ICD-10-CM | POA: Diagnosis not present

## 2020-06-30 DIAGNOSIS — J301 Allergic rhinitis due to pollen: Secondary | ICD-10-CM | POA: Diagnosis not present

## 2020-07-07 DIAGNOSIS — J301 Allergic rhinitis due to pollen: Secondary | ICD-10-CM | POA: Diagnosis not present

## 2020-07-14 DIAGNOSIS — J301 Allergic rhinitis due to pollen: Secondary | ICD-10-CM | POA: Diagnosis not present

## 2020-07-19 DIAGNOSIS — J324 Chronic pansinusitis: Secondary | ICD-10-CM | POA: Diagnosis not present

## 2020-07-19 DIAGNOSIS — J339 Nasal polyp, unspecified: Secondary | ICD-10-CM | POA: Diagnosis not present

## 2020-07-21 DIAGNOSIS — J301 Allergic rhinitis due to pollen: Secondary | ICD-10-CM | POA: Diagnosis not present

## 2020-07-21 DIAGNOSIS — D485 Neoplasm of uncertain behavior of skin: Secondary | ICD-10-CM | POA: Diagnosis not present

## 2020-07-21 DIAGNOSIS — L570 Actinic keratosis: Secondary | ICD-10-CM | POA: Diagnosis not present

## 2020-07-21 DIAGNOSIS — L814 Other melanin hyperpigmentation: Secondary | ICD-10-CM | POA: Diagnosis not present

## 2020-07-21 DIAGNOSIS — D225 Melanocytic nevi of trunk: Secondary | ICD-10-CM | POA: Diagnosis not present

## 2020-07-25 DIAGNOSIS — N50819 Testicular pain, unspecified: Secondary | ICD-10-CM | POA: Diagnosis not present

## 2020-08-01 DIAGNOSIS — J339 Nasal polyp, unspecified: Secondary | ICD-10-CM | POA: Diagnosis not present

## 2020-08-01 DIAGNOSIS — J329 Chronic sinusitis, unspecified: Secondary | ICD-10-CM | POA: Diagnosis not present

## 2020-08-01 DIAGNOSIS — Z79890 Hormone replacement therapy (postmenopausal): Secondary | ICD-10-CM | POA: Diagnosis not present

## 2020-08-01 DIAGNOSIS — Z87891 Personal history of nicotine dependence: Secondary | ICD-10-CM | POA: Diagnosis not present

## 2020-08-01 DIAGNOSIS — Z91041 Radiographic dye allergy status: Secondary | ICD-10-CM | POA: Diagnosis not present

## 2020-08-01 DIAGNOSIS — J310 Chronic rhinitis: Secondary | ICD-10-CM | POA: Diagnosis not present

## 2020-08-01 DIAGNOSIS — J342 Deviated nasal septum: Secondary | ICD-10-CM | POA: Diagnosis not present

## 2020-08-01 DIAGNOSIS — Z79899 Other long term (current) drug therapy: Secondary | ICD-10-CM | POA: Diagnosis not present

## 2020-08-01 DIAGNOSIS — E039 Hypothyroidism, unspecified: Secondary | ICD-10-CM | POA: Diagnosis not present

## 2020-08-10 DIAGNOSIS — J324 Chronic pansinusitis: Secondary | ICD-10-CM | POA: Diagnosis not present

## 2020-08-11 DIAGNOSIS — J301 Allergic rhinitis due to pollen: Secondary | ICD-10-CM | POA: Diagnosis not present

## 2020-08-18 DIAGNOSIS — J301 Allergic rhinitis due to pollen: Secondary | ICD-10-CM | POA: Diagnosis not present

## 2020-09-01 DIAGNOSIS — J301 Allergic rhinitis due to pollen: Secondary | ICD-10-CM | POA: Diagnosis not present

## 2020-09-02 DIAGNOSIS — K648 Other hemorrhoids: Secondary | ICD-10-CM | POA: Diagnosis not present

## 2020-09-02 DIAGNOSIS — D125 Benign neoplasm of sigmoid colon: Secondary | ICD-10-CM | POA: Diagnosis not present

## 2020-09-02 DIAGNOSIS — Z86010 Personal history of colonic polyps: Secondary | ICD-10-CM | POA: Diagnosis not present

## 2020-09-02 DIAGNOSIS — K5730 Diverticulosis of large intestine without perforation or abscess without bleeding: Secondary | ICD-10-CM | POA: Diagnosis not present

## 2020-09-05 ENCOUNTER — Other Ambulatory Visit: Payer: Self-pay

## 2020-09-05 ENCOUNTER — Other Ambulatory Visit (HOSPITAL_COMMUNITY): Payer: Self-pay | Admitting: Family Medicine

## 2020-09-05 ENCOUNTER — Ambulatory Visit (HOSPITAL_COMMUNITY)
Admission: RE | Admit: 2020-09-05 | Discharge: 2020-09-05 | Disposition: A | Discharge status: Home / Self Care | Payer: BC Managed Care – PPO | Source: Ambulatory Visit | Attending: Family Medicine | Admitting: Family Medicine

## 2020-09-05 DIAGNOSIS — M79662 Pain in left lower leg: Secondary | ICD-10-CM | POA: Insufficient documentation

## 2020-09-05 DIAGNOSIS — M7989 Other specified soft tissue disorders: Secondary | ICD-10-CM

## 2020-09-05 DIAGNOSIS — M79605 Pain in left leg: Secondary | ICD-10-CM | POA: Diagnosis not present

## 2020-09-07 DIAGNOSIS — J310 Chronic rhinitis: Secondary | ICD-10-CM | POA: Diagnosis not present

## 2020-09-08 DIAGNOSIS — J301 Allergic rhinitis due to pollen: Secondary | ICD-10-CM | POA: Diagnosis not present

## 2020-09-15 DIAGNOSIS — J301 Allergic rhinitis due to pollen: Secondary | ICD-10-CM | POA: Diagnosis not present

## 2020-09-16 DIAGNOSIS — J301 Allergic rhinitis due to pollen: Secondary | ICD-10-CM | POA: Diagnosis not present

## 2020-09-22 DIAGNOSIS — N50812 Left testicular pain: Secondary | ICD-10-CM | POA: Diagnosis not present

## 2020-09-22 DIAGNOSIS — J301 Allergic rhinitis due to pollen: Secondary | ICD-10-CM | POA: Diagnosis not present

## 2020-09-22 DIAGNOSIS — R350 Frequency of micturition: Secondary | ICD-10-CM | POA: Diagnosis not present

## 2020-09-22 DIAGNOSIS — R3121 Asymptomatic microscopic hematuria: Secondary | ICD-10-CM | POA: Diagnosis not present

## 2020-09-22 DIAGNOSIS — R300 Dysuria: Secondary | ICD-10-CM | POA: Diagnosis not present

## 2020-09-22 DIAGNOSIS — N411 Chronic prostatitis: Secondary | ICD-10-CM | POA: Diagnosis not present

## 2020-09-29 DIAGNOSIS — J301 Allergic rhinitis due to pollen: Secondary | ICD-10-CM | POA: Diagnosis not present

## 2020-10-03 DIAGNOSIS — R3121 Asymptomatic microscopic hematuria: Secondary | ICD-10-CM | POA: Diagnosis not present

## 2020-10-06 DIAGNOSIS — J301 Allergic rhinitis due to pollen: Secondary | ICD-10-CM | POA: Diagnosis not present

## 2020-10-13 DIAGNOSIS — J301 Allergic rhinitis due to pollen: Secondary | ICD-10-CM | POA: Diagnosis not present

## 2020-10-27 DIAGNOSIS — J301 Allergic rhinitis due to pollen: Secondary | ICD-10-CM | POA: Diagnosis not present

## 2020-10-28 DIAGNOSIS — N50812 Left testicular pain: Secondary | ICD-10-CM | POA: Diagnosis not present

## 2020-10-28 DIAGNOSIS — N411 Chronic prostatitis: Secondary | ICD-10-CM | POA: Diagnosis not present

## 2020-11-03 DIAGNOSIS — J301 Allergic rhinitis due to pollen: Secondary | ICD-10-CM | POA: Diagnosis not present

## 2020-11-10 DIAGNOSIS — J301 Allergic rhinitis due to pollen: Secondary | ICD-10-CM | POA: Diagnosis not present

## 2020-11-17 DIAGNOSIS — J301 Allergic rhinitis due to pollen: Secondary | ICD-10-CM | POA: Diagnosis not present

## 2020-11-22 DIAGNOSIS — Z23 Encounter for immunization: Secondary | ICD-10-CM | POA: Diagnosis not present

## 2020-11-24 DIAGNOSIS — J301 Allergic rhinitis due to pollen: Secondary | ICD-10-CM | POA: Diagnosis not present

## 2020-12-01 DIAGNOSIS — J301 Allergic rhinitis due to pollen: Secondary | ICD-10-CM | POA: Diagnosis not present

## 2020-12-13 DIAGNOSIS — J310 Chronic rhinitis: Secondary | ICD-10-CM | POA: Diagnosis not present

## 2020-12-15 DIAGNOSIS — J301 Allergic rhinitis due to pollen: Secondary | ICD-10-CM | POA: Diagnosis not present

## 2020-12-16 DIAGNOSIS — J301 Allergic rhinitis due to pollen: Secondary | ICD-10-CM | POA: Diagnosis not present

## 2020-12-29 DIAGNOSIS — J301 Allergic rhinitis due to pollen: Secondary | ICD-10-CM | POA: Diagnosis not present

## 2021-01-10 DIAGNOSIS — Z23 Encounter for immunization: Secondary | ICD-10-CM | POA: Diagnosis not present

## 2021-01-12 DIAGNOSIS — J301 Allergic rhinitis due to pollen: Secondary | ICD-10-CM | POA: Diagnosis not present

## 2021-01-19 DIAGNOSIS — J301 Allergic rhinitis due to pollen: Secondary | ICD-10-CM | POA: Diagnosis not present

## 2021-01-26 DIAGNOSIS — J301 Allergic rhinitis due to pollen: Secondary | ICD-10-CM | POA: Diagnosis not present

## 2021-02-16 DIAGNOSIS — Z125 Encounter for screening for malignant neoplasm of prostate: Secondary | ICD-10-CM | POA: Diagnosis not present

## 2021-02-16 DIAGNOSIS — E039 Hypothyroidism, unspecified: Secondary | ICD-10-CM | POA: Diagnosis not present

## 2021-02-16 DIAGNOSIS — Z0000 Encounter for general adult medical examination without abnormal findings: Secondary | ICD-10-CM | POA: Diagnosis not present

## 2021-02-16 DIAGNOSIS — E785 Hyperlipidemia, unspecified: Secondary | ICD-10-CM | POA: Diagnosis not present

## 2021-02-16 DIAGNOSIS — J301 Allergic rhinitis due to pollen: Secondary | ICD-10-CM | POA: Diagnosis not present

## 2021-02-23 DIAGNOSIS — J301 Allergic rhinitis due to pollen: Secondary | ICD-10-CM | POA: Diagnosis not present

## 2021-03-02 DIAGNOSIS — Z125 Encounter for screening for malignant neoplasm of prostate: Secondary | ICD-10-CM | POA: Diagnosis not present

## 2021-03-02 DIAGNOSIS — E039 Hypothyroidism, unspecified: Secondary | ICD-10-CM | POA: Diagnosis not present

## 2021-03-02 DIAGNOSIS — J301 Allergic rhinitis due to pollen: Secondary | ICD-10-CM | POA: Diagnosis not present

## 2021-03-02 DIAGNOSIS — E785 Hyperlipidemia, unspecified: Secondary | ICD-10-CM | POA: Diagnosis not present

## 2021-03-09 DIAGNOSIS — J301 Allergic rhinitis due to pollen: Secondary | ICD-10-CM | POA: Diagnosis not present

## 2021-03-16 DIAGNOSIS — J301 Allergic rhinitis due to pollen: Secondary | ICD-10-CM | POA: Diagnosis not present

## 2021-03-23 DIAGNOSIS — J301 Allergic rhinitis due to pollen: Secondary | ICD-10-CM | POA: Diagnosis not present

## 2021-03-30 DIAGNOSIS — J301 Allergic rhinitis due to pollen: Secondary | ICD-10-CM | POA: Diagnosis not present

## 2021-04-06 DIAGNOSIS — J301 Allergic rhinitis due to pollen: Secondary | ICD-10-CM | POA: Diagnosis not present

## 2021-04-12 DIAGNOSIS — J310 Chronic rhinitis: Secondary | ICD-10-CM | POA: Diagnosis not present

## 2021-04-13 DIAGNOSIS — J301 Allergic rhinitis due to pollen: Secondary | ICD-10-CM | POA: Diagnosis not present

## 2021-04-20 DIAGNOSIS — J301 Allergic rhinitis due to pollen: Secondary | ICD-10-CM | POA: Diagnosis not present

## 2021-04-27 DIAGNOSIS — J301 Allergic rhinitis due to pollen: Secondary | ICD-10-CM | POA: Diagnosis not present

## 2021-05-04 DIAGNOSIS — J301 Allergic rhinitis due to pollen: Secondary | ICD-10-CM | POA: Diagnosis not present

## 2021-05-11 DIAGNOSIS — J301 Allergic rhinitis due to pollen: Secondary | ICD-10-CM | POA: Diagnosis not present

## 2021-05-18 DIAGNOSIS — J301 Allergic rhinitis due to pollen: Secondary | ICD-10-CM | POA: Diagnosis not present

## 2021-05-22 DIAGNOSIS — J3489 Other specified disorders of nose and nasal sinuses: Secondary | ICD-10-CM | POA: Diagnosis not present

## 2021-05-22 DIAGNOSIS — M950 Acquired deformity of nose: Secondary | ICD-10-CM | POA: Diagnosis not present

## 2021-05-22 DIAGNOSIS — J342 Deviated nasal septum: Secondary | ICD-10-CM | POA: Diagnosis not present

## 2021-05-24 DIAGNOSIS — J301 Allergic rhinitis due to pollen: Secondary | ICD-10-CM | POA: Diagnosis not present

## 2021-05-25 DIAGNOSIS — J301 Allergic rhinitis due to pollen: Secondary | ICD-10-CM | POA: Diagnosis not present

## 2021-06-01 DIAGNOSIS — J301 Allergic rhinitis due to pollen: Secondary | ICD-10-CM | POA: Diagnosis not present

## 2021-06-08 DIAGNOSIS — J301 Allergic rhinitis due to pollen: Secondary | ICD-10-CM | POA: Diagnosis not present

## 2021-06-15 DIAGNOSIS — J301 Allergic rhinitis due to pollen: Secondary | ICD-10-CM | POA: Diagnosis not present

## 2021-06-22 DIAGNOSIS — J301 Allergic rhinitis due to pollen: Secondary | ICD-10-CM | POA: Diagnosis not present

## 2021-06-29 DIAGNOSIS — J301 Allergic rhinitis due to pollen: Secondary | ICD-10-CM | POA: Diagnosis not present

## 2021-07-06 DIAGNOSIS — J301 Allergic rhinitis due to pollen: Secondary | ICD-10-CM | POA: Diagnosis not present

## 2021-07-13 DIAGNOSIS — J301 Allergic rhinitis due to pollen: Secondary | ICD-10-CM | POA: Diagnosis not present

## 2021-07-17 DIAGNOSIS — L821 Other seborrheic keratosis: Secondary | ICD-10-CM | POA: Diagnosis not present

## 2021-07-17 DIAGNOSIS — D225 Melanocytic nevi of trunk: Secondary | ICD-10-CM | POA: Diagnosis not present

## 2021-07-17 DIAGNOSIS — L814 Other melanin hyperpigmentation: Secondary | ICD-10-CM | POA: Diagnosis not present

## 2021-07-18 DIAGNOSIS — J310 Chronic rhinitis: Secondary | ICD-10-CM | POA: Diagnosis not present

## 2021-07-20 DIAGNOSIS — J301 Allergic rhinitis due to pollen: Secondary | ICD-10-CM | POA: Diagnosis not present

## 2021-07-27 DIAGNOSIS — J301 Allergic rhinitis due to pollen: Secondary | ICD-10-CM | POA: Diagnosis not present

## 2021-08-10 DIAGNOSIS — J301 Allergic rhinitis due to pollen: Secondary | ICD-10-CM | POA: Diagnosis not present

## 2021-08-17 DIAGNOSIS — J301 Allergic rhinitis due to pollen: Secondary | ICD-10-CM | POA: Diagnosis not present

## 2021-08-18 DIAGNOSIS — J301 Allergic rhinitis due to pollen: Secondary | ICD-10-CM | POA: Diagnosis not present

## 2021-08-31 DIAGNOSIS — J301 Allergic rhinitis due to pollen: Secondary | ICD-10-CM | POA: Diagnosis not present

## 2021-09-07 DIAGNOSIS — J301 Allergic rhinitis due to pollen: Secondary | ICD-10-CM | POA: Diagnosis not present

## 2021-09-14 DIAGNOSIS — J301 Allergic rhinitis due to pollen: Secondary | ICD-10-CM | POA: Diagnosis not present

## 2021-09-21 DIAGNOSIS — J301 Allergic rhinitis due to pollen: Secondary | ICD-10-CM | POA: Diagnosis not present

## 2021-09-28 DIAGNOSIS — J301 Allergic rhinitis due to pollen: Secondary | ICD-10-CM | POA: Diagnosis not present

## 2021-10-05 DIAGNOSIS — J301 Allergic rhinitis due to pollen: Secondary | ICD-10-CM | POA: Diagnosis not present

## 2021-10-12 DIAGNOSIS — J301 Allergic rhinitis due to pollen: Secondary | ICD-10-CM | POA: Diagnosis not present

## 2021-10-17 DIAGNOSIS — R051 Acute cough: Secondary | ICD-10-CM | POA: Diagnosis not present

## 2021-10-17 DIAGNOSIS — Z03818 Encounter for observation for suspected exposure to other biological agents ruled out: Secondary | ICD-10-CM | POA: Diagnosis not present

## 2021-10-17 DIAGNOSIS — J4521 Mild intermittent asthma with (acute) exacerbation: Secondary | ICD-10-CM | POA: Diagnosis not present

## 2021-10-17 DIAGNOSIS — J029 Acute pharyngitis, unspecified: Secondary | ICD-10-CM | POA: Diagnosis not present

## 2021-10-22 DIAGNOSIS — N400 Enlarged prostate without lower urinary tract symptoms: Secondary | ICD-10-CM | POA: Diagnosis not present

## 2021-10-22 DIAGNOSIS — Z87828 Personal history of other (healed) physical injury and trauma: Secondary | ICD-10-CM | POA: Diagnosis not present

## 2021-10-22 DIAGNOSIS — E039 Hypothyroidism, unspecified: Secondary | ICD-10-CM | POA: Diagnosis not present

## 2021-10-22 DIAGNOSIS — J302 Other seasonal allergic rhinitis: Secondary | ICD-10-CM | POA: Diagnosis not present

## 2021-10-22 DIAGNOSIS — F419 Anxiety disorder, unspecified: Secondary | ICD-10-CM | POA: Diagnosis not present

## 2021-10-22 DIAGNOSIS — F1220 Cannabis dependence, uncomplicated: Secondary | ICD-10-CM | POA: Diagnosis not present

## 2021-10-22 DIAGNOSIS — Z87891 Personal history of nicotine dependence: Secondary | ICD-10-CM | POA: Diagnosis not present

## 2021-10-22 DIAGNOSIS — F514 Sleep terrors [night terrors]: Secondary | ICD-10-CM | POA: Diagnosis not present

## 2021-10-22 DIAGNOSIS — F1320 Sedative, hypnotic or anxiolytic dependence, uncomplicated: Secondary | ICD-10-CM | POA: Diagnosis not present

## 2021-10-22 DIAGNOSIS — J328 Other chronic sinusitis: Secondary | ICD-10-CM | POA: Diagnosis not present

## 2021-10-22 DIAGNOSIS — G4700 Insomnia, unspecified: Secondary | ICD-10-CM | POA: Diagnosis not present

## 2021-10-22 DIAGNOSIS — R319 Hematuria, unspecified: Secondary | ICD-10-CM | POA: Diagnosis not present

## 2021-10-22 DIAGNOSIS — F1120 Opioid dependence, uncomplicated: Secondary | ICD-10-CM | POA: Diagnosis not present

## 2021-10-22 DIAGNOSIS — F329 Major depressive disorder, single episode, unspecified: Secondary | ICD-10-CM | POA: Diagnosis not present

## 2021-10-27 DIAGNOSIS — G4700 Insomnia, unspecified: Secondary | ICD-10-CM | POA: Diagnosis not present

## 2021-10-27 DIAGNOSIS — F1320 Sedative, hypnotic or anxiolytic dependence, uncomplicated: Secondary | ICD-10-CM | POA: Diagnosis not present

## 2021-10-27 DIAGNOSIS — E039 Hypothyroidism, unspecified: Secondary | ICD-10-CM | POA: Diagnosis not present

## 2021-10-27 DIAGNOSIS — J302 Other seasonal allergic rhinitis: Secondary | ICD-10-CM | POA: Diagnosis not present

## 2021-10-27 DIAGNOSIS — N400 Enlarged prostate without lower urinary tract symptoms: Secondary | ICD-10-CM | POA: Diagnosis not present

## 2021-10-27 DIAGNOSIS — F419 Anxiety disorder, unspecified: Secondary | ICD-10-CM | POA: Diagnosis not present

## 2021-10-27 DIAGNOSIS — F4310 Post-traumatic stress disorder, unspecified: Secondary | ICD-10-CM | POA: Diagnosis not present

## 2021-10-27 DIAGNOSIS — F329 Major depressive disorder, single episode, unspecified: Secondary | ICD-10-CM | POA: Diagnosis not present

## 2021-10-27 DIAGNOSIS — R319 Hematuria, unspecified: Secondary | ICD-10-CM | POA: Diagnosis not present

## 2021-10-27 DIAGNOSIS — J328 Other chronic sinusitis: Secondary | ICD-10-CM | POA: Diagnosis not present

## 2021-10-27 DIAGNOSIS — Z87828 Personal history of other (healed) physical injury and trauma: Secondary | ICD-10-CM | POA: Diagnosis not present

## 2021-10-27 DIAGNOSIS — F514 Sleep terrors [night terrors]: Secondary | ICD-10-CM | POA: Diagnosis not present

## 2021-10-27 DIAGNOSIS — Z87891 Personal history of nicotine dependence: Secondary | ICD-10-CM | POA: Diagnosis not present

## 2021-10-27 DIAGNOSIS — F1120 Opioid dependence, uncomplicated: Secondary | ICD-10-CM | POA: Diagnosis not present

## 2021-10-27 DIAGNOSIS — F1220 Cannabis dependence, uncomplicated: Secondary | ICD-10-CM | POA: Diagnosis not present

## 2021-11-01 DIAGNOSIS — Z87891 Personal history of nicotine dependence: Secondary | ICD-10-CM | POA: Diagnosis not present

## 2021-11-01 DIAGNOSIS — J302 Other seasonal allergic rhinitis: Secondary | ICD-10-CM | POA: Diagnosis not present

## 2021-11-01 DIAGNOSIS — R319 Hematuria, unspecified: Secondary | ICD-10-CM | POA: Diagnosis not present

## 2021-11-01 DIAGNOSIS — F1320 Sedative, hypnotic or anxiolytic dependence, uncomplicated: Secondary | ICD-10-CM | POA: Diagnosis not present

## 2021-11-01 DIAGNOSIS — F514 Sleep terrors [night terrors]: Secondary | ICD-10-CM | POA: Diagnosis not present

## 2021-11-01 DIAGNOSIS — N400 Enlarged prostate without lower urinary tract symptoms: Secondary | ICD-10-CM | POA: Diagnosis not present

## 2021-11-01 DIAGNOSIS — F329 Major depressive disorder, single episode, unspecified: Secondary | ICD-10-CM | POA: Diagnosis not present

## 2021-11-01 DIAGNOSIS — G4700 Insomnia, unspecified: Secondary | ICD-10-CM | POA: Diagnosis not present

## 2021-11-01 DIAGNOSIS — Z87828 Personal history of other (healed) physical injury and trauma: Secondary | ICD-10-CM | POA: Diagnosis not present

## 2021-11-01 DIAGNOSIS — E039 Hypothyroidism, unspecified: Secondary | ICD-10-CM | POA: Diagnosis not present

## 2021-11-01 DIAGNOSIS — F4310 Post-traumatic stress disorder, unspecified: Secondary | ICD-10-CM | POA: Diagnosis not present

## 2021-11-01 DIAGNOSIS — F419 Anxiety disorder, unspecified: Secondary | ICD-10-CM | POA: Diagnosis not present

## 2021-11-01 DIAGNOSIS — F1120 Opioid dependence, uncomplicated: Secondary | ICD-10-CM | POA: Diagnosis not present

## 2021-11-01 DIAGNOSIS — F1220 Cannabis dependence, uncomplicated: Secondary | ICD-10-CM | POA: Diagnosis not present

## 2021-11-01 DIAGNOSIS — J328 Other chronic sinusitis: Secondary | ICD-10-CM | POA: Diagnosis not present

## 2021-11-02 DIAGNOSIS — F1120 Opioid dependence, uncomplicated: Secondary | ICD-10-CM | POA: Diagnosis not present

## 2021-11-02 DIAGNOSIS — F419 Anxiety disorder, unspecified: Secondary | ICD-10-CM | POA: Diagnosis not present

## 2021-11-02 DIAGNOSIS — Z87891 Personal history of nicotine dependence: Secondary | ICD-10-CM | POA: Diagnosis not present

## 2021-11-02 DIAGNOSIS — E039 Hypothyroidism, unspecified: Secondary | ICD-10-CM | POA: Diagnosis not present

## 2021-11-02 DIAGNOSIS — F4310 Post-traumatic stress disorder, unspecified: Secondary | ICD-10-CM | POA: Diagnosis not present

## 2021-11-02 DIAGNOSIS — F1220 Cannabis dependence, uncomplicated: Secondary | ICD-10-CM | POA: Diagnosis not present

## 2021-11-02 DIAGNOSIS — F514 Sleep terrors [night terrors]: Secondary | ICD-10-CM | POA: Diagnosis not present

## 2021-11-02 DIAGNOSIS — N400 Enlarged prostate without lower urinary tract symptoms: Secondary | ICD-10-CM | POA: Diagnosis not present

## 2021-11-02 DIAGNOSIS — R319 Hematuria, unspecified: Secondary | ICD-10-CM | POA: Diagnosis not present

## 2021-11-02 DIAGNOSIS — J328 Other chronic sinusitis: Secondary | ICD-10-CM | POA: Diagnosis not present

## 2021-11-02 DIAGNOSIS — F1320 Sedative, hypnotic or anxiolytic dependence, uncomplicated: Secondary | ICD-10-CM | POA: Diagnosis not present

## 2021-11-02 DIAGNOSIS — G4700 Insomnia, unspecified: Secondary | ICD-10-CM | POA: Diagnosis not present

## 2021-11-02 DIAGNOSIS — J302 Other seasonal allergic rhinitis: Secondary | ICD-10-CM | POA: Diagnosis not present

## 2021-11-02 DIAGNOSIS — F329 Major depressive disorder, single episode, unspecified: Secondary | ICD-10-CM | POA: Diagnosis not present

## 2021-11-02 DIAGNOSIS — Z87828 Personal history of other (healed) physical injury and trauma: Secondary | ICD-10-CM | POA: Diagnosis not present

## 2021-11-03 DIAGNOSIS — U071 COVID-19: Secondary | ICD-10-CM | POA: Diagnosis not present

## 2021-11-06 DIAGNOSIS — U071 COVID-19: Secondary | ICD-10-CM | POA: Diagnosis not present

## 2021-11-14 DIAGNOSIS — J328 Other chronic sinusitis: Secondary | ICD-10-CM | POA: Diagnosis not present

## 2021-11-14 DIAGNOSIS — N400 Enlarged prostate without lower urinary tract symptoms: Secondary | ICD-10-CM | POA: Diagnosis not present

## 2021-11-14 DIAGNOSIS — F329 Major depressive disorder, single episode, unspecified: Secondary | ICD-10-CM | POA: Diagnosis not present

## 2021-11-14 DIAGNOSIS — E039 Hypothyroidism, unspecified: Secondary | ICD-10-CM | POA: Diagnosis not present

## 2021-11-14 DIAGNOSIS — F1320 Sedative, hypnotic or anxiolytic dependence, uncomplicated: Secondary | ICD-10-CM | POA: Diagnosis not present

## 2021-11-14 DIAGNOSIS — Z87891 Personal history of nicotine dependence: Secondary | ICD-10-CM | POA: Diagnosis not present

## 2021-11-14 DIAGNOSIS — F5101 Primary insomnia: Secondary | ICD-10-CM | POA: Diagnosis not present

## 2021-11-14 DIAGNOSIS — F4312 Post-traumatic stress disorder, chronic: Secondary | ICD-10-CM | POA: Diagnosis not present

## 2021-11-14 DIAGNOSIS — F419 Anxiety disorder, unspecified: Secondary | ICD-10-CM | POA: Diagnosis not present

## 2021-11-14 DIAGNOSIS — Z87828 Personal history of other (healed) physical injury and trauma: Secondary | ICD-10-CM | POA: Diagnosis not present

## 2021-11-14 DIAGNOSIS — F514 Sleep terrors [night terrors]: Secondary | ICD-10-CM | POA: Diagnosis not present

## 2021-11-14 DIAGNOSIS — F1120 Opioid dependence, uncomplicated: Secondary | ICD-10-CM | POA: Diagnosis not present

## 2021-11-14 DIAGNOSIS — J302 Other seasonal allergic rhinitis: Secondary | ICD-10-CM | POA: Diagnosis not present

## 2021-11-14 DIAGNOSIS — F1220 Cannabis dependence, uncomplicated: Secondary | ICD-10-CM | POA: Diagnosis not present

## 2021-11-14 DIAGNOSIS — F513 Sleepwalking [somnambulism]: Secondary | ICD-10-CM | POA: Diagnosis not present

## 2021-11-14 DIAGNOSIS — M25569 Pain in unspecified knee: Secondary | ICD-10-CM | POA: Diagnosis not present

## 2021-11-15 DIAGNOSIS — F1120 Opioid dependence, uncomplicated: Secondary | ICD-10-CM | POA: Diagnosis not present

## 2021-11-15 DIAGNOSIS — F329 Major depressive disorder, single episode, unspecified: Secondary | ICD-10-CM | POA: Diagnosis not present

## 2021-11-15 DIAGNOSIS — F419 Anxiety disorder, unspecified: Secondary | ICD-10-CM | POA: Diagnosis not present

## 2021-11-15 DIAGNOSIS — J302 Other seasonal allergic rhinitis: Secondary | ICD-10-CM | POA: Diagnosis not present

## 2021-11-15 DIAGNOSIS — Z87891 Personal history of nicotine dependence: Secondary | ICD-10-CM | POA: Diagnosis not present

## 2021-11-15 DIAGNOSIS — F1320 Sedative, hypnotic or anxiolytic dependence, uncomplicated: Secondary | ICD-10-CM | POA: Diagnosis not present

## 2021-11-15 DIAGNOSIS — F1220 Cannabis dependence, uncomplicated: Secondary | ICD-10-CM | POA: Diagnosis not present

## 2021-11-15 DIAGNOSIS — E039 Hypothyroidism, unspecified: Secondary | ICD-10-CM | POA: Diagnosis not present

## 2021-11-15 DIAGNOSIS — F4312 Post-traumatic stress disorder, chronic: Secondary | ICD-10-CM | POA: Diagnosis not present

## 2021-11-15 DIAGNOSIS — F514 Sleep terrors [night terrors]: Secondary | ICD-10-CM | POA: Diagnosis not present

## 2021-11-15 DIAGNOSIS — F5101 Primary insomnia: Secondary | ICD-10-CM | POA: Diagnosis not present

## 2021-11-15 DIAGNOSIS — Z87828 Personal history of other (healed) physical injury and trauma: Secondary | ICD-10-CM | POA: Diagnosis not present

## 2021-11-15 DIAGNOSIS — M25569 Pain in unspecified knee: Secondary | ICD-10-CM | POA: Diagnosis not present

## 2021-11-15 DIAGNOSIS — J328 Other chronic sinusitis: Secondary | ICD-10-CM | POA: Diagnosis not present

## 2021-11-15 DIAGNOSIS — F513 Sleepwalking [somnambulism]: Secondary | ICD-10-CM | POA: Diagnosis not present

## 2021-11-15 DIAGNOSIS — N400 Enlarged prostate without lower urinary tract symptoms: Secondary | ICD-10-CM | POA: Diagnosis not present

## 2021-11-16 DIAGNOSIS — F4312 Post-traumatic stress disorder, chronic: Secondary | ICD-10-CM | POA: Diagnosis not present

## 2021-11-16 DIAGNOSIS — M25569 Pain in unspecified knee: Secondary | ICD-10-CM | POA: Diagnosis not present

## 2021-11-16 DIAGNOSIS — F419 Anxiety disorder, unspecified: Secondary | ICD-10-CM | POA: Diagnosis not present

## 2021-11-16 DIAGNOSIS — F1320 Sedative, hypnotic or anxiolytic dependence, uncomplicated: Secondary | ICD-10-CM | POA: Diagnosis not present

## 2021-11-16 DIAGNOSIS — F1220 Cannabis dependence, uncomplicated: Secondary | ICD-10-CM | POA: Diagnosis not present

## 2021-11-16 DIAGNOSIS — F513 Sleepwalking [somnambulism]: Secondary | ICD-10-CM | POA: Diagnosis not present

## 2021-11-16 DIAGNOSIS — F514 Sleep terrors [night terrors]: Secondary | ICD-10-CM | POA: Diagnosis not present

## 2021-11-16 DIAGNOSIS — Z87891 Personal history of nicotine dependence: Secondary | ICD-10-CM | POA: Diagnosis not present

## 2021-11-16 DIAGNOSIS — F329 Major depressive disorder, single episode, unspecified: Secondary | ICD-10-CM | POA: Diagnosis not present

## 2021-11-16 DIAGNOSIS — E039 Hypothyroidism, unspecified: Secondary | ICD-10-CM | POA: Diagnosis not present

## 2021-11-16 DIAGNOSIS — F1120 Opioid dependence, uncomplicated: Secondary | ICD-10-CM | POA: Diagnosis not present

## 2021-11-16 DIAGNOSIS — J328 Other chronic sinusitis: Secondary | ICD-10-CM | POA: Diagnosis not present

## 2021-11-16 DIAGNOSIS — N400 Enlarged prostate without lower urinary tract symptoms: Secondary | ICD-10-CM | POA: Diagnosis not present

## 2021-11-16 DIAGNOSIS — J302 Other seasonal allergic rhinitis: Secondary | ICD-10-CM | POA: Diagnosis not present

## 2021-11-16 DIAGNOSIS — F5101 Primary insomnia: Secondary | ICD-10-CM | POA: Diagnosis not present

## 2021-11-16 DIAGNOSIS — Z87828 Personal history of other (healed) physical injury and trauma: Secondary | ICD-10-CM | POA: Diagnosis not present

## 2021-11-17 DIAGNOSIS — E039 Hypothyroidism, unspecified: Secondary | ICD-10-CM | POA: Diagnosis not present

## 2021-11-17 DIAGNOSIS — N400 Enlarged prostate without lower urinary tract symptoms: Secondary | ICD-10-CM | POA: Diagnosis not present

## 2021-11-17 DIAGNOSIS — J328 Other chronic sinusitis: Secondary | ICD-10-CM | POA: Diagnosis not present

## 2021-11-17 DIAGNOSIS — F329 Major depressive disorder, single episode, unspecified: Secondary | ICD-10-CM | POA: Diagnosis not present

## 2021-11-17 DIAGNOSIS — F419 Anxiety disorder, unspecified: Secondary | ICD-10-CM | POA: Diagnosis not present

## 2021-11-17 DIAGNOSIS — F1320 Sedative, hypnotic or anxiolytic dependence, uncomplicated: Secondary | ICD-10-CM | POA: Diagnosis not present

## 2021-11-17 DIAGNOSIS — F1220 Cannabis dependence, uncomplicated: Secondary | ICD-10-CM | POA: Diagnosis not present

## 2021-11-17 DIAGNOSIS — M25569 Pain in unspecified knee: Secondary | ICD-10-CM | POA: Diagnosis not present

## 2021-11-17 DIAGNOSIS — F513 Sleepwalking [somnambulism]: Secondary | ICD-10-CM | POA: Diagnosis not present

## 2021-11-17 DIAGNOSIS — F5101 Primary insomnia: Secondary | ICD-10-CM | POA: Diagnosis not present

## 2021-11-17 DIAGNOSIS — J302 Other seasonal allergic rhinitis: Secondary | ICD-10-CM | POA: Diagnosis not present

## 2021-11-17 DIAGNOSIS — F514 Sleep terrors [night terrors]: Secondary | ICD-10-CM | POA: Diagnosis not present

## 2021-11-17 DIAGNOSIS — F1120 Opioid dependence, uncomplicated: Secondary | ICD-10-CM | POA: Diagnosis not present

## 2021-11-17 DIAGNOSIS — Z87828 Personal history of other (healed) physical injury and trauma: Secondary | ICD-10-CM | POA: Diagnosis not present

## 2021-11-17 DIAGNOSIS — Z87891 Personal history of nicotine dependence: Secondary | ICD-10-CM | POA: Diagnosis not present

## 2021-11-17 DIAGNOSIS — F4312 Post-traumatic stress disorder, chronic: Secondary | ICD-10-CM | POA: Diagnosis not present

## 2021-11-18 DIAGNOSIS — E039 Hypothyroidism, unspecified: Secondary | ICD-10-CM | POA: Diagnosis not present

## 2021-11-18 DIAGNOSIS — F329 Major depressive disorder, single episode, unspecified: Secondary | ICD-10-CM | POA: Diagnosis not present

## 2021-11-18 DIAGNOSIS — F5101 Primary insomnia: Secondary | ICD-10-CM | POA: Diagnosis not present

## 2021-11-18 DIAGNOSIS — Z87891 Personal history of nicotine dependence: Secondary | ICD-10-CM | POA: Diagnosis not present

## 2021-11-18 DIAGNOSIS — M25569 Pain in unspecified knee: Secondary | ICD-10-CM | POA: Diagnosis not present

## 2021-11-18 DIAGNOSIS — F419 Anxiety disorder, unspecified: Secondary | ICD-10-CM | POA: Diagnosis not present

## 2021-11-18 DIAGNOSIS — F4312 Post-traumatic stress disorder, chronic: Secondary | ICD-10-CM | POA: Diagnosis not present

## 2021-11-18 DIAGNOSIS — F1120 Opioid dependence, uncomplicated: Secondary | ICD-10-CM | POA: Diagnosis not present

## 2021-11-18 DIAGNOSIS — F513 Sleepwalking [somnambulism]: Secondary | ICD-10-CM | POA: Diagnosis not present

## 2021-11-18 DIAGNOSIS — F514 Sleep terrors [night terrors]: Secondary | ICD-10-CM | POA: Diagnosis not present

## 2021-11-18 DIAGNOSIS — F1320 Sedative, hypnotic or anxiolytic dependence, uncomplicated: Secondary | ICD-10-CM | POA: Diagnosis not present

## 2021-11-18 DIAGNOSIS — N400 Enlarged prostate without lower urinary tract symptoms: Secondary | ICD-10-CM | POA: Diagnosis not present

## 2021-11-18 DIAGNOSIS — J328 Other chronic sinusitis: Secondary | ICD-10-CM | POA: Diagnosis not present

## 2021-11-18 DIAGNOSIS — F1220 Cannabis dependence, uncomplicated: Secondary | ICD-10-CM | POA: Diagnosis not present

## 2021-11-18 DIAGNOSIS — Z87828 Personal history of other (healed) physical injury and trauma: Secondary | ICD-10-CM | POA: Diagnosis not present

## 2021-11-18 DIAGNOSIS — J302 Other seasonal allergic rhinitis: Secondary | ICD-10-CM | POA: Diagnosis not present

## 2021-11-19 DIAGNOSIS — F1220 Cannabis dependence, uncomplicated: Secondary | ICD-10-CM | POA: Diagnosis not present

## 2021-11-19 DIAGNOSIS — F514 Sleep terrors [night terrors]: Secondary | ICD-10-CM | POA: Diagnosis not present

## 2021-11-19 DIAGNOSIS — N400 Enlarged prostate without lower urinary tract symptoms: Secondary | ICD-10-CM | POA: Diagnosis not present

## 2021-11-19 DIAGNOSIS — E039 Hypothyroidism, unspecified: Secondary | ICD-10-CM | POA: Diagnosis not present

## 2021-11-19 DIAGNOSIS — Z87828 Personal history of other (healed) physical injury and trauma: Secondary | ICD-10-CM | POA: Diagnosis not present

## 2021-11-19 DIAGNOSIS — M25569 Pain in unspecified knee: Secondary | ICD-10-CM | POA: Diagnosis not present

## 2021-11-19 DIAGNOSIS — F1320 Sedative, hypnotic or anxiolytic dependence, uncomplicated: Secondary | ICD-10-CM | POA: Diagnosis not present

## 2021-11-19 DIAGNOSIS — F513 Sleepwalking [somnambulism]: Secondary | ICD-10-CM | POA: Diagnosis not present

## 2021-11-19 DIAGNOSIS — F4312 Post-traumatic stress disorder, chronic: Secondary | ICD-10-CM | POA: Diagnosis not present

## 2021-11-19 DIAGNOSIS — F5101 Primary insomnia: Secondary | ICD-10-CM | POA: Diagnosis not present

## 2021-11-19 DIAGNOSIS — J302 Other seasonal allergic rhinitis: Secondary | ICD-10-CM | POA: Diagnosis not present

## 2021-11-19 DIAGNOSIS — F329 Major depressive disorder, single episode, unspecified: Secondary | ICD-10-CM | POA: Diagnosis not present

## 2021-11-19 DIAGNOSIS — J328 Other chronic sinusitis: Secondary | ICD-10-CM | POA: Diagnosis not present

## 2021-11-19 DIAGNOSIS — Z87891 Personal history of nicotine dependence: Secondary | ICD-10-CM | POA: Diagnosis not present

## 2021-11-19 DIAGNOSIS — F419 Anxiety disorder, unspecified: Secondary | ICD-10-CM | POA: Diagnosis not present

## 2021-11-19 DIAGNOSIS — F1120 Opioid dependence, uncomplicated: Secondary | ICD-10-CM | POA: Diagnosis not present

## 2021-11-20 DIAGNOSIS — N400 Enlarged prostate without lower urinary tract symptoms: Secondary | ICD-10-CM | POA: Diagnosis not present

## 2021-11-20 DIAGNOSIS — Z87828 Personal history of other (healed) physical injury and trauma: Secondary | ICD-10-CM | POA: Diagnosis not present

## 2021-11-20 DIAGNOSIS — E039 Hypothyroidism, unspecified: Secondary | ICD-10-CM | POA: Diagnosis not present

## 2021-11-20 DIAGNOSIS — F1120 Opioid dependence, uncomplicated: Secondary | ICD-10-CM | POA: Diagnosis not present

## 2021-11-20 DIAGNOSIS — J302 Other seasonal allergic rhinitis: Secondary | ICD-10-CM | POA: Diagnosis not present

## 2021-11-20 DIAGNOSIS — F1320 Sedative, hypnotic or anxiolytic dependence, uncomplicated: Secondary | ICD-10-CM | POA: Diagnosis not present

## 2021-11-20 DIAGNOSIS — F419 Anxiety disorder, unspecified: Secondary | ICD-10-CM | POA: Diagnosis not present

## 2021-11-20 DIAGNOSIS — F329 Major depressive disorder, single episode, unspecified: Secondary | ICD-10-CM | POA: Diagnosis not present

## 2021-11-20 DIAGNOSIS — Z87891 Personal history of nicotine dependence: Secondary | ICD-10-CM | POA: Diagnosis not present

## 2021-11-20 DIAGNOSIS — M25569 Pain in unspecified knee: Secondary | ICD-10-CM | POA: Diagnosis not present

## 2021-11-20 DIAGNOSIS — F5101 Primary insomnia: Secondary | ICD-10-CM | POA: Diagnosis not present

## 2021-11-20 DIAGNOSIS — F514 Sleep terrors [night terrors]: Secondary | ICD-10-CM | POA: Diagnosis not present

## 2021-11-20 DIAGNOSIS — J328 Other chronic sinusitis: Secondary | ICD-10-CM | POA: Diagnosis not present

## 2021-11-20 DIAGNOSIS — F513 Sleepwalking [somnambulism]: Secondary | ICD-10-CM | POA: Diagnosis not present

## 2021-11-20 DIAGNOSIS — F1220 Cannabis dependence, uncomplicated: Secondary | ICD-10-CM | POA: Diagnosis not present

## 2021-11-20 DIAGNOSIS — F4312 Post-traumatic stress disorder, chronic: Secondary | ICD-10-CM | POA: Diagnosis not present

## 2021-11-21 DIAGNOSIS — J328 Other chronic sinusitis: Secondary | ICD-10-CM | POA: Diagnosis not present

## 2021-11-21 DIAGNOSIS — F4312 Post-traumatic stress disorder, chronic: Secondary | ICD-10-CM | POA: Diagnosis not present

## 2021-11-21 DIAGNOSIS — F513 Sleepwalking [somnambulism]: Secondary | ICD-10-CM | POA: Diagnosis not present

## 2021-11-21 DIAGNOSIS — J302 Other seasonal allergic rhinitis: Secondary | ICD-10-CM | POA: Diagnosis not present

## 2021-11-21 DIAGNOSIS — F514 Sleep terrors [night terrors]: Secondary | ICD-10-CM | POA: Diagnosis not present

## 2021-11-21 DIAGNOSIS — Z87828 Personal history of other (healed) physical injury and trauma: Secondary | ICD-10-CM | POA: Diagnosis not present

## 2021-11-21 DIAGNOSIS — M25569 Pain in unspecified knee: Secondary | ICD-10-CM | POA: Diagnosis not present

## 2021-11-21 DIAGNOSIS — F1320 Sedative, hypnotic or anxiolytic dependence, uncomplicated: Secondary | ICD-10-CM | POA: Diagnosis not present

## 2021-11-21 DIAGNOSIS — N400 Enlarged prostate without lower urinary tract symptoms: Secondary | ICD-10-CM | POA: Diagnosis not present

## 2021-11-21 DIAGNOSIS — F5101 Primary insomnia: Secondary | ICD-10-CM | POA: Diagnosis not present

## 2021-11-21 DIAGNOSIS — F1220 Cannabis dependence, uncomplicated: Secondary | ICD-10-CM | POA: Diagnosis not present

## 2021-11-21 DIAGNOSIS — Z87891 Personal history of nicotine dependence: Secondary | ICD-10-CM | POA: Diagnosis not present

## 2021-11-21 DIAGNOSIS — F1120 Opioid dependence, uncomplicated: Secondary | ICD-10-CM | POA: Diagnosis not present

## 2021-11-21 DIAGNOSIS — F419 Anxiety disorder, unspecified: Secondary | ICD-10-CM | POA: Diagnosis not present

## 2021-11-21 DIAGNOSIS — E039 Hypothyroidism, unspecified: Secondary | ICD-10-CM | POA: Diagnosis not present

## 2021-11-21 DIAGNOSIS — F329 Major depressive disorder, single episode, unspecified: Secondary | ICD-10-CM | POA: Diagnosis not present

## 2021-11-22 DIAGNOSIS — Z87828 Personal history of other (healed) physical injury and trauma: Secondary | ICD-10-CM | POA: Diagnosis not present

## 2021-11-22 DIAGNOSIS — F1220 Cannabis dependence, uncomplicated: Secondary | ICD-10-CM | POA: Diagnosis not present

## 2021-11-22 DIAGNOSIS — F514 Sleep terrors [night terrors]: Secondary | ICD-10-CM | POA: Diagnosis not present

## 2021-11-22 DIAGNOSIS — F419 Anxiety disorder, unspecified: Secondary | ICD-10-CM | POA: Diagnosis not present

## 2021-11-22 DIAGNOSIS — F1120 Opioid dependence, uncomplicated: Secondary | ICD-10-CM | POA: Diagnosis not present

## 2021-11-22 DIAGNOSIS — F5101 Primary insomnia: Secondary | ICD-10-CM | POA: Diagnosis not present

## 2021-11-22 DIAGNOSIS — J302 Other seasonal allergic rhinitis: Secondary | ICD-10-CM | POA: Diagnosis not present

## 2021-11-22 DIAGNOSIS — E039 Hypothyroidism, unspecified: Secondary | ICD-10-CM | POA: Diagnosis not present

## 2021-11-22 DIAGNOSIS — F4312 Post-traumatic stress disorder, chronic: Secondary | ICD-10-CM | POA: Diagnosis not present

## 2021-11-22 DIAGNOSIS — M25569 Pain in unspecified knee: Secondary | ICD-10-CM | POA: Diagnosis not present

## 2021-11-22 DIAGNOSIS — Z87891 Personal history of nicotine dependence: Secondary | ICD-10-CM | POA: Diagnosis not present

## 2021-11-22 DIAGNOSIS — N400 Enlarged prostate without lower urinary tract symptoms: Secondary | ICD-10-CM | POA: Diagnosis not present

## 2021-11-22 DIAGNOSIS — F1320 Sedative, hypnotic or anxiolytic dependence, uncomplicated: Secondary | ICD-10-CM | POA: Diagnosis not present

## 2021-11-22 DIAGNOSIS — F513 Sleepwalking [somnambulism]: Secondary | ICD-10-CM | POA: Diagnosis not present

## 2021-11-22 DIAGNOSIS — J328 Other chronic sinusitis: Secondary | ICD-10-CM | POA: Diagnosis not present

## 2021-11-22 DIAGNOSIS — F329 Major depressive disorder, single episode, unspecified: Secondary | ICD-10-CM | POA: Diagnosis not present

## 2021-11-23 DIAGNOSIS — F419 Anxiety disorder, unspecified: Secondary | ICD-10-CM | POA: Diagnosis not present

## 2021-11-23 DIAGNOSIS — M25569 Pain in unspecified knee: Secondary | ICD-10-CM | POA: Diagnosis not present

## 2021-11-23 DIAGNOSIS — F5101 Primary insomnia: Secondary | ICD-10-CM | POA: Diagnosis not present

## 2021-11-23 DIAGNOSIS — F1320 Sedative, hypnotic or anxiolytic dependence, uncomplicated: Secondary | ICD-10-CM | POA: Diagnosis not present

## 2021-11-23 DIAGNOSIS — Z87891 Personal history of nicotine dependence: Secondary | ICD-10-CM | POA: Diagnosis not present

## 2021-11-23 DIAGNOSIS — J328 Other chronic sinusitis: Secondary | ICD-10-CM | POA: Diagnosis not present

## 2021-11-23 DIAGNOSIS — F513 Sleepwalking [somnambulism]: Secondary | ICD-10-CM | POA: Diagnosis not present

## 2021-11-23 DIAGNOSIS — J302 Other seasonal allergic rhinitis: Secondary | ICD-10-CM | POA: Diagnosis not present

## 2021-11-23 DIAGNOSIS — N400 Enlarged prostate without lower urinary tract symptoms: Secondary | ICD-10-CM | POA: Diagnosis not present

## 2021-11-23 DIAGNOSIS — F1220 Cannabis dependence, uncomplicated: Secondary | ICD-10-CM | POA: Diagnosis not present

## 2021-11-23 DIAGNOSIS — Z87828 Personal history of other (healed) physical injury and trauma: Secondary | ICD-10-CM | POA: Diagnosis not present

## 2021-11-23 DIAGNOSIS — E039 Hypothyroidism, unspecified: Secondary | ICD-10-CM | POA: Diagnosis not present

## 2021-11-23 DIAGNOSIS — F329 Major depressive disorder, single episode, unspecified: Secondary | ICD-10-CM | POA: Diagnosis not present

## 2021-11-23 DIAGNOSIS — F514 Sleep terrors [night terrors]: Secondary | ICD-10-CM | POA: Diagnosis not present

## 2021-11-23 DIAGNOSIS — F4312 Post-traumatic stress disorder, chronic: Secondary | ICD-10-CM | POA: Diagnosis not present

## 2021-11-23 DIAGNOSIS — F1120 Opioid dependence, uncomplicated: Secondary | ICD-10-CM | POA: Diagnosis not present

## 2021-11-24 DIAGNOSIS — F513 Sleepwalking [somnambulism]: Secondary | ICD-10-CM | POA: Diagnosis not present

## 2021-11-24 DIAGNOSIS — Z87891 Personal history of nicotine dependence: Secondary | ICD-10-CM | POA: Diagnosis not present

## 2021-11-24 DIAGNOSIS — F329 Major depressive disorder, single episode, unspecified: Secondary | ICD-10-CM | POA: Diagnosis not present

## 2021-11-24 DIAGNOSIS — J328 Other chronic sinusitis: Secondary | ICD-10-CM | POA: Diagnosis not present

## 2021-11-24 DIAGNOSIS — J302 Other seasonal allergic rhinitis: Secondary | ICD-10-CM | POA: Diagnosis not present

## 2021-11-24 DIAGNOSIS — M25569 Pain in unspecified knee: Secondary | ICD-10-CM | POA: Diagnosis not present

## 2021-11-24 DIAGNOSIS — F1120 Opioid dependence, uncomplicated: Secondary | ICD-10-CM | POA: Diagnosis not present

## 2021-11-24 DIAGNOSIS — E039 Hypothyroidism, unspecified: Secondary | ICD-10-CM | POA: Diagnosis not present

## 2021-11-24 DIAGNOSIS — Z87828 Personal history of other (healed) physical injury and trauma: Secondary | ICD-10-CM | POA: Diagnosis not present

## 2021-11-24 DIAGNOSIS — F4312 Post-traumatic stress disorder, chronic: Secondary | ICD-10-CM | POA: Diagnosis not present

## 2021-11-24 DIAGNOSIS — F419 Anxiety disorder, unspecified: Secondary | ICD-10-CM | POA: Diagnosis not present

## 2021-11-24 DIAGNOSIS — F1220 Cannabis dependence, uncomplicated: Secondary | ICD-10-CM | POA: Diagnosis not present

## 2021-11-24 DIAGNOSIS — F5101 Primary insomnia: Secondary | ICD-10-CM | POA: Diagnosis not present

## 2021-11-24 DIAGNOSIS — F514 Sleep terrors [night terrors]: Secondary | ICD-10-CM | POA: Diagnosis not present

## 2021-11-24 DIAGNOSIS — F1320 Sedative, hypnotic or anxiolytic dependence, uncomplicated: Secondary | ICD-10-CM | POA: Diagnosis not present

## 2021-11-24 DIAGNOSIS — N400 Enlarged prostate without lower urinary tract symptoms: Secondary | ICD-10-CM | POA: Diagnosis not present

## 2021-11-25 DIAGNOSIS — Z87828 Personal history of other (healed) physical injury and trauma: Secondary | ICD-10-CM | POA: Diagnosis not present

## 2021-11-25 DIAGNOSIS — F329 Major depressive disorder, single episode, unspecified: Secondary | ICD-10-CM | POA: Diagnosis not present

## 2021-11-25 DIAGNOSIS — J302 Other seasonal allergic rhinitis: Secondary | ICD-10-CM | POA: Diagnosis not present

## 2021-11-25 DIAGNOSIS — F1320 Sedative, hypnotic or anxiolytic dependence, uncomplicated: Secondary | ICD-10-CM | POA: Diagnosis not present

## 2021-11-25 DIAGNOSIS — E039 Hypothyroidism, unspecified: Secondary | ICD-10-CM | POA: Diagnosis not present

## 2021-11-25 DIAGNOSIS — J328 Other chronic sinusitis: Secondary | ICD-10-CM | POA: Diagnosis not present

## 2021-11-25 DIAGNOSIS — Z87891 Personal history of nicotine dependence: Secondary | ICD-10-CM | POA: Diagnosis not present

## 2021-11-25 DIAGNOSIS — F1120 Opioid dependence, uncomplicated: Secondary | ICD-10-CM | POA: Diagnosis not present

## 2021-11-25 DIAGNOSIS — F5101 Primary insomnia: Secondary | ICD-10-CM | POA: Diagnosis not present

## 2021-11-25 DIAGNOSIS — F513 Sleepwalking [somnambulism]: Secondary | ICD-10-CM | POA: Diagnosis not present

## 2021-11-25 DIAGNOSIS — F4312 Post-traumatic stress disorder, chronic: Secondary | ICD-10-CM | POA: Diagnosis not present

## 2021-11-25 DIAGNOSIS — N400 Enlarged prostate without lower urinary tract symptoms: Secondary | ICD-10-CM | POA: Diagnosis not present

## 2021-11-25 DIAGNOSIS — F419 Anxiety disorder, unspecified: Secondary | ICD-10-CM | POA: Diagnosis not present

## 2021-11-25 DIAGNOSIS — F514 Sleep terrors [night terrors]: Secondary | ICD-10-CM | POA: Diagnosis not present

## 2021-11-25 DIAGNOSIS — M25569 Pain in unspecified knee: Secondary | ICD-10-CM | POA: Diagnosis not present

## 2021-11-25 DIAGNOSIS — F1220 Cannabis dependence, uncomplicated: Secondary | ICD-10-CM | POA: Diagnosis not present

## 2021-11-26 DIAGNOSIS — F4312 Post-traumatic stress disorder, chronic: Secondary | ICD-10-CM | POA: Diagnosis not present

## 2021-11-26 DIAGNOSIS — Z87891 Personal history of nicotine dependence: Secondary | ICD-10-CM | POA: Diagnosis not present

## 2021-11-26 DIAGNOSIS — J302 Other seasonal allergic rhinitis: Secondary | ICD-10-CM | POA: Diagnosis not present

## 2021-11-26 DIAGNOSIS — N400 Enlarged prostate without lower urinary tract symptoms: Secondary | ICD-10-CM | POA: Diagnosis not present

## 2021-11-26 DIAGNOSIS — F1120 Opioid dependence, uncomplicated: Secondary | ICD-10-CM | POA: Diagnosis not present

## 2021-11-26 DIAGNOSIS — F329 Major depressive disorder, single episode, unspecified: Secondary | ICD-10-CM | POA: Diagnosis not present

## 2021-11-26 DIAGNOSIS — F1320 Sedative, hypnotic or anxiolytic dependence, uncomplicated: Secondary | ICD-10-CM | POA: Diagnosis not present

## 2021-11-26 DIAGNOSIS — F419 Anxiety disorder, unspecified: Secondary | ICD-10-CM | POA: Diagnosis not present

## 2021-11-26 DIAGNOSIS — F513 Sleepwalking [somnambulism]: Secondary | ICD-10-CM | POA: Diagnosis not present

## 2021-11-26 DIAGNOSIS — Z87828 Personal history of other (healed) physical injury and trauma: Secondary | ICD-10-CM | POA: Diagnosis not present

## 2021-11-26 DIAGNOSIS — F1220 Cannabis dependence, uncomplicated: Secondary | ICD-10-CM | POA: Diagnosis not present

## 2021-11-26 DIAGNOSIS — F514 Sleep terrors [night terrors]: Secondary | ICD-10-CM | POA: Diagnosis not present

## 2021-11-26 DIAGNOSIS — J328 Other chronic sinusitis: Secondary | ICD-10-CM | POA: Diagnosis not present

## 2021-11-26 DIAGNOSIS — F5101 Primary insomnia: Secondary | ICD-10-CM | POA: Diagnosis not present

## 2021-11-26 DIAGNOSIS — M25569 Pain in unspecified knee: Secondary | ICD-10-CM | POA: Diagnosis not present

## 2021-11-26 DIAGNOSIS — E039 Hypothyroidism, unspecified: Secondary | ICD-10-CM | POA: Diagnosis not present

## 2021-11-27 DIAGNOSIS — F4312 Post-traumatic stress disorder, chronic: Secondary | ICD-10-CM | POA: Diagnosis not present

## 2021-11-27 DIAGNOSIS — F1320 Sedative, hypnotic or anxiolytic dependence, uncomplicated: Secondary | ICD-10-CM | POA: Diagnosis not present

## 2021-11-27 DIAGNOSIS — Z87828 Personal history of other (healed) physical injury and trauma: Secondary | ICD-10-CM | POA: Diagnosis not present

## 2021-11-27 DIAGNOSIS — M25569 Pain in unspecified knee: Secondary | ICD-10-CM | POA: Diagnosis not present

## 2021-11-27 DIAGNOSIS — F1120 Opioid dependence, uncomplicated: Secondary | ICD-10-CM | POA: Diagnosis not present

## 2021-11-27 DIAGNOSIS — F5101 Primary insomnia: Secondary | ICD-10-CM | POA: Diagnosis not present

## 2021-11-27 DIAGNOSIS — F513 Sleepwalking [somnambulism]: Secondary | ICD-10-CM | POA: Diagnosis not present

## 2021-11-27 DIAGNOSIS — N400 Enlarged prostate without lower urinary tract symptoms: Secondary | ICD-10-CM | POA: Diagnosis not present

## 2021-11-27 DIAGNOSIS — F1220 Cannabis dependence, uncomplicated: Secondary | ICD-10-CM | POA: Diagnosis not present

## 2021-11-27 DIAGNOSIS — E039 Hypothyroidism, unspecified: Secondary | ICD-10-CM | POA: Diagnosis not present

## 2021-11-27 DIAGNOSIS — F419 Anxiety disorder, unspecified: Secondary | ICD-10-CM | POA: Diagnosis not present

## 2021-11-27 DIAGNOSIS — F329 Major depressive disorder, single episode, unspecified: Secondary | ICD-10-CM | POA: Diagnosis not present

## 2021-11-27 DIAGNOSIS — Z87891 Personal history of nicotine dependence: Secondary | ICD-10-CM | POA: Diagnosis not present

## 2021-11-27 DIAGNOSIS — J302 Other seasonal allergic rhinitis: Secondary | ICD-10-CM | POA: Diagnosis not present

## 2021-11-27 DIAGNOSIS — J328 Other chronic sinusitis: Secondary | ICD-10-CM | POA: Diagnosis not present

## 2021-11-27 DIAGNOSIS — F514 Sleep terrors [night terrors]: Secondary | ICD-10-CM | POA: Diagnosis not present

## 2021-11-28 DIAGNOSIS — F4312 Post-traumatic stress disorder, chronic: Secondary | ICD-10-CM | POA: Diagnosis not present

## 2021-11-28 DIAGNOSIS — Z87828 Personal history of other (healed) physical injury and trauma: Secondary | ICD-10-CM | POA: Diagnosis not present

## 2021-11-28 DIAGNOSIS — F1120 Opioid dependence, uncomplicated: Secondary | ICD-10-CM | POA: Diagnosis not present

## 2021-11-28 DIAGNOSIS — J302 Other seasonal allergic rhinitis: Secondary | ICD-10-CM | POA: Diagnosis not present

## 2021-11-28 DIAGNOSIS — F419 Anxiety disorder, unspecified: Secondary | ICD-10-CM | POA: Diagnosis not present

## 2021-11-28 DIAGNOSIS — F1320 Sedative, hypnotic or anxiolytic dependence, uncomplicated: Secondary | ICD-10-CM | POA: Diagnosis not present

## 2021-11-28 DIAGNOSIS — F329 Major depressive disorder, single episode, unspecified: Secondary | ICD-10-CM | POA: Diagnosis not present

## 2021-11-28 DIAGNOSIS — F1220 Cannabis dependence, uncomplicated: Secondary | ICD-10-CM | POA: Diagnosis not present

## 2021-11-28 DIAGNOSIS — N400 Enlarged prostate without lower urinary tract symptoms: Secondary | ICD-10-CM | POA: Diagnosis not present

## 2021-11-28 DIAGNOSIS — E039 Hypothyroidism, unspecified: Secondary | ICD-10-CM | POA: Diagnosis not present

## 2021-11-28 DIAGNOSIS — M25569 Pain in unspecified knee: Secondary | ICD-10-CM | POA: Diagnosis not present

## 2021-11-28 DIAGNOSIS — F514 Sleep terrors [night terrors]: Secondary | ICD-10-CM | POA: Diagnosis not present

## 2021-11-28 DIAGNOSIS — J328 Other chronic sinusitis: Secondary | ICD-10-CM | POA: Diagnosis not present

## 2021-11-28 DIAGNOSIS — F513 Sleepwalking [somnambulism]: Secondary | ICD-10-CM | POA: Diagnosis not present

## 2021-11-28 DIAGNOSIS — F5101 Primary insomnia: Secondary | ICD-10-CM | POA: Diagnosis not present

## 2021-11-28 DIAGNOSIS — Z87891 Personal history of nicotine dependence: Secondary | ICD-10-CM | POA: Diagnosis not present

## 2021-11-29 DIAGNOSIS — F1220 Cannabis dependence, uncomplicated: Secondary | ICD-10-CM | POA: Diagnosis not present

## 2021-11-29 DIAGNOSIS — E039 Hypothyroidism, unspecified: Secondary | ICD-10-CM | POA: Diagnosis not present

## 2021-11-29 DIAGNOSIS — M25569 Pain in unspecified knee: Secondary | ICD-10-CM | POA: Diagnosis not present

## 2021-11-29 DIAGNOSIS — F419 Anxiety disorder, unspecified: Secondary | ICD-10-CM | POA: Diagnosis not present

## 2021-11-29 DIAGNOSIS — J328 Other chronic sinusitis: Secondary | ICD-10-CM | POA: Diagnosis not present

## 2021-11-29 DIAGNOSIS — F5101 Primary insomnia: Secondary | ICD-10-CM | POA: Diagnosis not present

## 2021-11-29 DIAGNOSIS — F329 Major depressive disorder, single episode, unspecified: Secondary | ICD-10-CM | POA: Diagnosis not present

## 2021-11-29 DIAGNOSIS — F1320 Sedative, hypnotic or anxiolytic dependence, uncomplicated: Secondary | ICD-10-CM | POA: Diagnosis not present

## 2021-11-29 DIAGNOSIS — N400 Enlarged prostate without lower urinary tract symptoms: Secondary | ICD-10-CM | POA: Diagnosis not present

## 2021-11-29 DIAGNOSIS — Z87828 Personal history of other (healed) physical injury and trauma: Secondary | ICD-10-CM | POA: Diagnosis not present

## 2021-11-29 DIAGNOSIS — F1120 Opioid dependence, uncomplicated: Secondary | ICD-10-CM | POA: Diagnosis not present

## 2021-11-29 DIAGNOSIS — F4312 Post-traumatic stress disorder, chronic: Secondary | ICD-10-CM | POA: Diagnosis not present

## 2021-11-29 DIAGNOSIS — Z87891 Personal history of nicotine dependence: Secondary | ICD-10-CM | POA: Diagnosis not present

## 2021-11-29 DIAGNOSIS — F513 Sleepwalking [somnambulism]: Secondary | ICD-10-CM | POA: Diagnosis not present

## 2021-11-29 DIAGNOSIS — F514 Sleep terrors [night terrors]: Secondary | ICD-10-CM | POA: Diagnosis not present

## 2021-11-29 DIAGNOSIS — J302 Other seasonal allergic rhinitis: Secondary | ICD-10-CM | POA: Diagnosis not present

## 2021-11-30 DIAGNOSIS — F1320 Sedative, hypnotic or anxiolytic dependence, uncomplicated: Secondary | ICD-10-CM | POA: Diagnosis not present

## 2021-11-30 DIAGNOSIS — F513 Sleepwalking [somnambulism]: Secondary | ICD-10-CM | POA: Diagnosis not present

## 2021-11-30 DIAGNOSIS — F5101 Primary insomnia: Secondary | ICD-10-CM | POA: Diagnosis not present

## 2021-11-30 DIAGNOSIS — Z87828 Personal history of other (healed) physical injury and trauma: Secondary | ICD-10-CM | POA: Diagnosis not present

## 2021-11-30 DIAGNOSIS — F514 Sleep terrors [night terrors]: Secondary | ICD-10-CM | POA: Diagnosis not present

## 2021-11-30 DIAGNOSIS — F1220 Cannabis dependence, uncomplicated: Secondary | ICD-10-CM | POA: Diagnosis not present

## 2021-11-30 DIAGNOSIS — J328 Other chronic sinusitis: Secondary | ICD-10-CM | POA: Diagnosis not present

## 2021-11-30 DIAGNOSIS — F1120 Opioid dependence, uncomplicated: Secondary | ICD-10-CM | POA: Diagnosis not present

## 2021-11-30 DIAGNOSIS — E039 Hypothyroidism, unspecified: Secondary | ICD-10-CM | POA: Diagnosis not present

## 2021-11-30 DIAGNOSIS — M25569 Pain in unspecified knee: Secondary | ICD-10-CM | POA: Diagnosis not present

## 2021-11-30 DIAGNOSIS — F329 Major depressive disorder, single episode, unspecified: Secondary | ICD-10-CM | POA: Diagnosis not present

## 2021-11-30 DIAGNOSIS — N400 Enlarged prostate without lower urinary tract symptoms: Secondary | ICD-10-CM | POA: Diagnosis not present

## 2021-11-30 DIAGNOSIS — J302 Other seasonal allergic rhinitis: Secondary | ICD-10-CM | POA: Diagnosis not present

## 2021-11-30 DIAGNOSIS — Z87891 Personal history of nicotine dependence: Secondary | ICD-10-CM | POA: Diagnosis not present

## 2021-11-30 DIAGNOSIS — F4312 Post-traumatic stress disorder, chronic: Secondary | ICD-10-CM | POA: Diagnosis not present

## 2021-11-30 DIAGNOSIS — F419 Anxiety disorder, unspecified: Secondary | ICD-10-CM | POA: Diagnosis not present

## 2021-12-01 DIAGNOSIS — E039 Hypothyroidism, unspecified: Secondary | ICD-10-CM | POA: Diagnosis not present

## 2021-12-01 DIAGNOSIS — F4312 Post-traumatic stress disorder, chronic: Secondary | ICD-10-CM | POA: Diagnosis not present

## 2021-12-01 DIAGNOSIS — F1320 Sedative, hypnotic or anxiolytic dependence, uncomplicated: Secondary | ICD-10-CM | POA: Diagnosis not present

## 2021-12-01 DIAGNOSIS — F5101 Primary insomnia: Secondary | ICD-10-CM | POA: Diagnosis not present

## 2021-12-01 DIAGNOSIS — Z87828 Personal history of other (healed) physical injury and trauma: Secondary | ICD-10-CM | POA: Diagnosis not present

## 2021-12-01 DIAGNOSIS — N400 Enlarged prostate without lower urinary tract symptoms: Secondary | ICD-10-CM | POA: Diagnosis not present

## 2021-12-01 DIAGNOSIS — F419 Anxiety disorder, unspecified: Secondary | ICD-10-CM | POA: Diagnosis not present

## 2021-12-01 DIAGNOSIS — J302 Other seasonal allergic rhinitis: Secondary | ICD-10-CM | POA: Diagnosis not present

## 2021-12-01 DIAGNOSIS — F1220 Cannabis dependence, uncomplicated: Secondary | ICD-10-CM | POA: Diagnosis not present

## 2021-12-01 DIAGNOSIS — F1120 Opioid dependence, uncomplicated: Secondary | ICD-10-CM | POA: Diagnosis not present

## 2021-12-01 DIAGNOSIS — F329 Major depressive disorder, single episode, unspecified: Secondary | ICD-10-CM | POA: Diagnosis not present

## 2021-12-01 DIAGNOSIS — M25569 Pain in unspecified knee: Secondary | ICD-10-CM | POA: Diagnosis not present

## 2021-12-01 DIAGNOSIS — F513 Sleepwalking [somnambulism]: Secondary | ICD-10-CM | POA: Diagnosis not present

## 2021-12-01 DIAGNOSIS — Z87891 Personal history of nicotine dependence: Secondary | ICD-10-CM | POA: Diagnosis not present

## 2021-12-01 DIAGNOSIS — J328 Other chronic sinusitis: Secondary | ICD-10-CM | POA: Diagnosis not present

## 2021-12-01 DIAGNOSIS — F514 Sleep terrors [night terrors]: Secondary | ICD-10-CM | POA: Diagnosis not present

## 2021-12-11 DIAGNOSIS — J301 Allergic rhinitis due to pollen: Secondary | ICD-10-CM | POA: Diagnosis not present

## 2021-12-20 DIAGNOSIS — J324 Chronic pansinusitis: Secondary | ICD-10-CM | POA: Diagnosis not present

## 2021-12-21 DIAGNOSIS — J301 Allergic rhinitis due to pollen: Secondary | ICD-10-CM | POA: Diagnosis not present

## 2021-12-28 DIAGNOSIS — J301 Allergic rhinitis due to pollen: Secondary | ICD-10-CM | POA: Diagnosis not present

## 2022-01-01 DIAGNOSIS — F331 Major depressive disorder, recurrent, moderate: Secondary | ICD-10-CM | POA: Diagnosis not present

## 2022-01-01 DIAGNOSIS — F411 Generalized anxiety disorder: Secondary | ICD-10-CM | POA: Diagnosis not present

## 2022-01-02 DIAGNOSIS — G4700 Insomnia, unspecified: Secondary | ICD-10-CM | POA: Diagnosis not present

## 2022-01-02 DIAGNOSIS — E039 Hypothyroidism, unspecified: Secondary | ICD-10-CM | POA: Diagnosis not present

## 2022-01-02 DIAGNOSIS — F411 Generalized anxiety disorder: Secondary | ICD-10-CM | POA: Diagnosis not present

## 2022-01-08 DIAGNOSIS — F331 Major depressive disorder, recurrent, moderate: Secondary | ICD-10-CM | POA: Diagnosis not present

## 2022-01-11 DIAGNOSIS — J301 Allergic rhinitis due to pollen: Secondary | ICD-10-CM | POA: Diagnosis not present

## 2022-01-15 DIAGNOSIS — F331 Major depressive disorder, recurrent, moderate: Secondary | ICD-10-CM | POA: Diagnosis not present

## 2022-01-18 DIAGNOSIS — J301 Allergic rhinitis due to pollen: Secondary | ICD-10-CM | POA: Diagnosis not present

## 2022-01-22 DIAGNOSIS — F411 Generalized anxiety disorder: Secondary | ICD-10-CM | POA: Diagnosis not present

## 2022-01-22 DIAGNOSIS — F331 Major depressive disorder, recurrent, moderate: Secondary | ICD-10-CM | POA: Diagnosis not present

## 2022-01-25 DIAGNOSIS — J301 Allergic rhinitis due to pollen: Secondary | ICD-10-CM | POA: Diagnosis not present

## 2022-01-29 DIAGNOSIS — F331 Major depressive disorder, recurrent, moderate: Secondary | ICD-10-CM | POA: Diagnosis not present

## 2022-01-29 DIAGNOSIS — F411 Generalized anxiety disorder: Secondary | ICD-10-CM | POA: Diagnosis not present

## 2022-02-07 DIAGNOSIS — K635 Polyp of colon: Secondary | ICD-10-CM | POA: Diagnosis not present

## 2022-02-07 DIAGNOSIS — Z86010 Personal history of colonic polyps: Secondary | ICD-10-CM | POA: Diagnosis not present

## 2022-02-07 DIAGNOSIS — K5730 Diverticulosis of large intestine without perforation or abscess without bleeding: Secondary | ICD-10-CM | POA: Diagnosis not present

## 2022-02-07 DIAGNOSIS — Z09 Encounter for follow-up examination after completed treatment for conditions other than malignant neoplasm: Secondary | ICD-10-CM | POA: Diagnosis not present

## 2022-02-15 DIAGNOSIS — E039 Hypothyroidism, unspecified: Secondary | ICD-10-CM | POA: Diagnosis not present

## 2022-02-15 DIAGNOSIS — Z125 Encounter for screening for malignant neoplasm of prostate: Secondary | ICD-10-CM | POA: Diagnosis not present

## 2022-02-15 DIAGNOSIS — F411 Generalized anxiety disorder: Secondary | ICD-10-CM | POA: Diagnosis not present

## 2022-02-15 DIAGNOSIS — Z0000 Encounter for general adult medical examination without abnormal findings: Secondary | ICD-10-CM | POA: Diagnosis not present

## 2022-02-15 DIAGNOSIS — J301 Allergic rhinitis due to pollen: Secondary | ICD-10-CM | POA: Diagnosis not present

## 2022-02-15 DIAGNOSIS — E785 Hyperlipidemia, unspecified: Secondary | ICD-10-CM | POA: Diagnosis not present

## 2022-02-19 DIAGNOSIS — F411 Generalized anxiety disorder: Secondary | ICD-10-CM | POA: Diagnosis not present

## 2022-02-19 DIAGNOSIS — F331 Major depressive disorder, recurrent, moderate: Secondary | ICD-10-CM | POA: Diagnosis not present

## 2022-02-21 DIAGNOSIS — J340 Abscess, furuncle and carbuncle of nose: Secondary | ICD-10-CM | POA: Diagnosis not present

## 2022-02-21 DIAGNOSIS — J309 Allergic rhinitis, unspecified: Secondary | ICD-10-CM | POA: Diagnosis not present

## 2022-02-22 DIAGNOSIS — J301 Allergic rhinitis due to pollen: Secondary | ICD-10-CM | POA: Diagnosis not present

## 2022-02-26 DIAGNOSIS — F331 Major depressive disorder, recurrent, moderate: Secondary | ICD-10-CM | POA: Diagnosis not present

## 2022-02-26 DIAGNOSIS — F411 Generalized anxiety disorder: Secondary | ICD-10-CM | POA: Diagnosis not present

## 2022-03-01 ENCOUNTER — Other Ambulatory Visit (HOSPITAL_COMMUNITY): Payer: Self-pay | Admitting: Family Medicine

## 2022-03-01 DIAGNOSIS — E785 Hyperlipidemia, unspecified: Secondary | ICD-10-CM

## 2022-03-01 DIAGNOSIS — J301 Allergic rhinitis due to pollen: Secondary | ICD-10-CM | POA: Diagnosis not present

## 2022-03-05 DIAGNOSIS — F331 Major depressive disorder, recurrent, moderate: Secondary | ICD-10-CM | POA: Diagnosis not present

## 2022-03-05 DIAGNOSIS — F411 Generalized anxiety disorder: Secondary | ICD-10-CM | POA: Diagnosis not present

## 2022-03-07 ENCOUNTER — Ambulatory Visit (HOSPITAL_BASED_OUTPATIENT_CLINIC_OR_DEPARTMENT_OTHER)
Admission: RE | Admit: 2022-03-07 | Discharge: 2022-03-07 | Disposition: A | Discharge status: Home / Self Care | Payer: No Typology Code available for payment source | Source: Ambulatory Visit | Attending: Family Medicine | Admitting: Family Medicine

## 2022-03-07 DIAGNOSIS — E785 Hyperlipidemia, unspecified: Secondary | ICD-10-CM | POA: Insufficient documentation

## 2022-03-07 DIAGNOSIS — J301 Allergic rhinitis due to pollen: Secondary | ICD-10-CM | POA: Diagnosis not present

## 2022-03-08 DIAGNOSIS — J301 Allergic rhinitis due to pollen: Secondary | ICD-10-CM | POA: Diagnosis not present

## 2022-03-12 DIAGNOSIS — F331 Major depressive disorder, recurrent, moderate: Secondary | ICD-10-CM | POA: Diagnosis not present

## 2022-03-12 DIAGNOSIS — F411 Generalized anxiety disorder: Secondary | ICD-10-CM | POA: Diagnosis not present

## 2022-03-15 DIAGNOSIS — J301 Allergic rhinitis due to pollen: Secondary | ICD-10-CM | POA: Diagnosis not present

## 2022-03-19 DIAGNOSIS — F411 Generalized anxiety disorder: Secondary | ICD-10-CM | POA: Diagnosis not present

## 2022-03-19 DIAGNOSIS — I2510 Atherosclerotic heart disease of native coronary artery without angina pectoris: Secondary | ICD-10-CM | POA: Diagnosis not present

## 2022-03-19 DIAGNOSIS — F331 Major depressive disorder, recurrent, moderate: Secondary | ICD-10-CM | POA: Diagnosis not present

## 2022-03-19 DIAGNOSIS — F418 Other specified anxiety disorders: Secondary | ICD-10-CM | POA: Diagnosis not present

## 2022-03-22 DIAGNOSIS — J301 Allergic rhinitis due to pollen: Secondary | ICD-10-CM | POA: Diagnosis not present

## 2022-03-23 DIAGNOSIS — B079 Viral wart, unspecified: Secondary | ICD-10-CM | POA: Diagnosis not present

## 2022-03-23 DIAGNOSIS — D485 Neoplasm of uncertain behavior of skin: Secondary | ICD-10-CM | POA: Diagnosis not present

## 2022-03-26 DIAGNOSIS — F411 Generalized anxiety disorder: Secondary | ICD-10-CM | POA: Diagnosis not present

## 2022-03-26 DIAGNOSIS — F331 Major depressive disorder, recurrent, moderate: Secondary | ICD-10-CM | POA: Diagnosis not present

## 2022-03-28 DIAGNOSIS — R351 Nocturia: Secondary | ICD-10-CM | POA: Diagnosis not present

## 2022-03-28 DIAGNOSIS — G479 Sleep disorder, unspecified: Secondary | ICD-10-CM | POA: Diagnosis not present

## 2022-03-28 DIAGNOSIS — G4730 Sleep apnea, unspecified: Secondary | ICD-10-CM | POA: Diagnosis not present

## 2022-03-28 DIAGNOSIS — R61 Generalized hyperhidrosis: Secondary | ICD-10-CM | POA: Diagnosis not present

## 2022-03-28 DIAGNOSIS — I10 Essential (primary) hypertension: Secondary | ICD-10-CM | POA: Diagnosis not present

## 2022-03-28 DIAGNOSIS — F513 Sleepwalking [somnambulism]: Secondary | ICD-10-CM | POA: Diagnosis not present

## 2022-03-28 DIAGNOSIS — G4733 Obstructive sleep apnea (adult) (pediatric): Secondary | ICD-10-CM | POA: Diagnosis not present

## 2022-03-28 DIAGNOSIS — R0683 Snoring: Secondary | ICD-10-CM | POA: Diagnosis not present

## 2022-03-28 DIAGNOSIS — G478 Other sleep disorders: Secondary | ICD-10-CM | POA: Diagnosis not present

## 2022-03-28 DIAGNOSIS — G2581 Restless legs syndrome: Secondary | ICD-10-CM | POA: Diagnosis not present

## 2022-03-28 DIAGNOSIS — G4751 Confusional arousals: Secondary | ICD-10-CM | POA: Diagnosis not present

## 2022-03-28 DIAGNOSIS — M26629 Arthralgia of temporomandibular joint, unspecified side: Secondary | ICD-10-CM | POA: Diagnosis not present

## 2022-03-28 DIAGNOSIS — R6889 Other general symptoms and signs: Secondary | ICD-10-CM | POA: Diagnosis not present

## 2022-03-28 DIAGNOSIS — R400 Somnolence: Secondary | ICD-10-CM | POA: Diagnosis not present

## 2022-04-01 NOTE — Progress Notes (Unsigned)
Cardiology Office Note:   Date:  04/03/2022  NAME:  Matthew Lawrence    MRN: [id number] DOB:  1975/04/07   PCP:  London Pepper, MD  Cardiologist:  None  Electrophysiologist:  None   Referring MD: London Pepper, MD   Chief Complaint  Patient presents with   New Patient (Initial Visit)         History of Present Illness:   Matthew Lawrence is a 47 y.o. male with a hx of coronary calcium, HLD who is being seen today for the evaluation of Coronary calcium at the request of London Pepper, MD. recently underwent coronary calcium scoring.  Elevated value as detailed below.  Reports no chest pain or trouble breathing.  No tobacco use.  No heavy alcohol use.  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  Suspect this could have been the issue.  EKG shows sinus rhythm with no acute ischemic changes or evidence of infarction.  CV examination normal.  He is married.  1 child.  Cholesterol elevated.  Started on Lipitor.  He is exercising.  Doing high interval training.  He does get winded with this but this is expected.  No chest pain or pressure.  No tightness in his chest.  Has been diagnosed with nasal polyps.  Possible salicylate allergy.  Also with colonic polyps.  Also reports his iron is low.  Working with his physician on this.  Exam normal today.  Working on his diet.  Denies any major cardiac symptoms in office today.  Problem List Coronary calcium  -CAC 387 (99th percentile) 2. HLD -T chol 254, LDL 186, TG 129, HDL 44  Past Medical History: Past Medical History:  Diagnosis Date   Allergic rhinitis    Anxiety    Facial pain    left-side   GERD (gastroesophageal reflux disease)    H/O chest pain    Cardionet, Palps   H/O sinus tachycardia    Cardionet   Hypertension    Hypothyroidism    Sleep apnea    He does not use a CPAP machine.    Past Surgical History: Past Surgical History:  Procedure Laterality Date   COLONOSCOPY     NASAL SINUS SURGERY     none      Current  Medications: Current Meds  Medication Sig   acetaminophen (TYLENOL) 325 MG tablet 325 mg every 4 (four) hours as needed for mild pain.   albuterol (VENTOLIN HFA) 108 (90 Base) MCG/ACT inhaler INHALE 1 TO 2 PUFFS EVERY 4 HOURS AS NEEDED   atorvastatin (LIPITOR) 20 MG tablet Take 20 mg by mouth daily.   EPINEPHrine 0.3 mg/0.3 mL IJ SOAJ injection INJECT 0.3 ML INTO THIGH ONCE FOR SEVERE ALLERGY REACTION   gentamicin ointment (GARAMYCIN) 0.1 % Apply 1 Application topically 3 (three) times daily.   hydrOXYzine (ATARAX) 50 MG tablet Take 50 mg by mouth 3 (three) times daily as needed for anxiety.   Iron-Vitamin C (VITRON-C) 65-125 MG TABS Take by mouth.   levocetirizine (XYZAL) 5 MG tablet Take 2.5 mg by mouth every evening.   levothyroxine (SYNTHROID, LEVOTHROID) 100 MCG tablet TK 1 T PO  QD   mometasone (NASONEX) 50 MCG/ACT nasal spray Place 2 sprays into the nose daily.   Omega-3 Fatty Acids (FISH OIL) 1000 MG CAPS 2 capsules Orally Once a day   sertraline (ZOLOFT) 50 MG tablet Take 50 mg by mouth daily.     Allergies:    Gadolinium derivatives, Iodinated contrast media,  Sulfa antibiotics, and Sulfamethoxazole-trimethoprim   Social History: Social History   Socioeconomic History   Marital status: Married    Spouse name: Not on file   Number of children: 1   Years of education: Bachelors+   Highest education level: Not on file  Occupational History   Occupation: Development worker, community   Occupation: [employer]  Tobacco Use   Smoking status: Every Day    Types: E-cigarettes   Smokeless tobacco: Not on file   Tobacco comments:    Quit smoking cigarettes 10+ years ago.  Substance and Sexual Activity   Alcohol use: Yes    Alcohol/week: 0.0 standard drinks of alcohol    Comment: Occasional use.   Drug use: Not Currently    Frequency: 1.0 times per week    Comment: Occasional use.   Sexual activity: Not on file  Other Topics Concern   Not on file  Social History Narrative   Lives at  home with fiance, Belenda Cruise.   Right-handed.   2 cups caffeine use.   Social Determinants of Health   Financial Resource Strain: Not on file  Food Insecurity: Not on file  Transportation Needs: Not on file  Physical Activity: Not on file  Stress: Not on file  Social Connections: Not on file     Family History: The patient's family history includes Cancer in his maternal grandfather; Emphysema in his paternal grandfather; Healthy in his father and mother.  ROS:   All other ROS reviewed and negative. Pertinent positives noted in the HPI.     EKGs/Labs/Other Studies Reviewed:   The following studies were personally reviewed by me today:  EKG:  EKG is ordered today.  The ekg ordered today demonstrates normal sinus rhythm heart rate 88, no acute ischemic changes or evidence of infarction, and was personally reviewed by me.   Recent Labs: No results found for requested labs within last 365 days.   Recent Lipid Panel No results found for: "CHOL", "TRIG", "HDL", "CHOLHDL", "VLDL", "LDLCALC", "LDLDIRECT"  Physical Exam:   VS:  BP 130/72 (BP Location: Left Arm, Patient Position: Sitting, Cuff Size: Normal)   Pulse 88   Ht 5' 9"$  (1.753 m)   Wt 190 lb 12.8 oz (86.5 kg)   SpO2 97%   BMI 28.18 kg/m    Wt Readings from Last 3 Encounters:  04/03/22 190 lb 12.8 oz (86.5 kg)  06/28/15 182 lb (82.6 kg)    General: Well nourished, well developed, in no acute distress Head: Atraumatic, normal size  Eyes: PEERLA, EOMI  Neck: Supple, no JVD Endocrine: No thryomegaly Cardiac: Normal S1, S2; RRR; no murmurs, rubs, or gallops Lungs: Clear to auscultation bilaterally, no wheezing, rhonchi or rales  Abd: Soft, nontender, no hepatomegaly  Ext: No edema, pulses 2+ Musculoskeletal: No deformities, BUE and BLE strength normal and equal Skin: Warm and dry, no rashes   Neuro: Alert and oriented to person, place, time, and situation, CNII-XII grossly intact, no focal deficits  Psych: Normal mood  and affect   ASSESSMENT:   Matthew Lawrence is a 47 y.o. male who presents for the following: 1. Agatston coronary artery calcium score between 100 and 400   2. Mixed hyperlipidemia     PLAN:   1. Agatston coronary artery calcium score between 100 and 400 2. Mixed hyperlipidemia -EKG shows normal sinus rhythm.  Coronary calcium score is 387 which is 99th percentile.  No symptoms of angina but would recommend an exercise treadmill stress test just to exclude any  obstructive CAD.  I would also like for him to get an echocardiogram.  His LDL cholesterol is 186.  This is too high.  Increase Lipitor to 40 mg daily.  Goal LDL cholesterol less than 55 given his age.  I think he will get there on Lipitor.  He does merit antiplatelet agent.  He reports possible allergy to salicylates.  Would start Plavix 75 mg daily.  He is iron deficient but recent colonoscopy shows no significant source of bleeding.  I think this is okay for now.  He will see me back in 1 year.  He will see one of our APP's in 2 months to recheck lipids and see how he is doing.  Disposition: Return in about 1 year (around 04/04/2023).  Medication Adjustments/Labs and Tests Ordered: Current medicines are reviewed at length with the patient today.  Concerns regarding medicines are outlined above.  No orders of the defined types were placed in this encounter.  No orders of the defined types were placed in this encounter.   Patient Instructions  Medication Instructions:  START Plavix 75 mg daily  INCREASE Lipitor 40 mg daily   *If you need a refill on your cardiac medications before your next appointment, please call your pharmacy*   Testing/Procedures: Your physician has requested that you have an exercise tolerance test, this is a screening tool to track your fitness level. This test evaluates the your exercise capacity by measuring cardiovascular response to exercise, the stress response is induced by exercise (exercise-treadmill).   Graded exercise test is also known as maximal exercise test or stress EKG test  . Please also follow instruction sheet given.   Echocardiogram - Your physician has requested that you have an echocardiogram. Echocardiography is a painless test that uses sound waves to create images of your heart. It provides your doctor with information about the size and shape of your heart and how well your heart's chambers and valves are working. This procedure takes approximately one hour. There are no restrictions for this procedure.     Follow-Up: At [facility], you and your health needs are our priority.  As part of our continuing mission to provide you with exceptional heart care, we have created designated Provider Care Teams.  These Care Teams include your primary Cardiologist (physician) and Advanced Practice Providers (APPs -  Physician Assistants and Nurse Practitioners) who all work together to provide you with the care you need, when you need it.  We recommend signing up for the patient portal called "MyChart".  Sign up information is provided on this After Visit Summary.  MyChart is used to connect with patients for Virtual Visits (Telemedicine).  Patients are able to view lab/test results, encounter notes, upcoming appointments, etc.  Non-urgent messages can be sent to your provider as well.   To learn more about what you can do with MyChart, go to [email or web address].    Your next appointment:   2 month(s)  Provider:   Sande Rives, PA-C or Almyra Deforest, PA-C    Then, None will plan to see you again in 12 month(s).       Signed, Addison Naegeli. Audie Box, MD, [place]  [address] [place], [place] [address] [phone]  04/03/2022 3:04 PM

## 2022-04-02 DIAGNOSIS — F331 Major depressive disorder, recurrent, moderate: Secondary | ICD-10-CM | POA: Diagnosis not present

## 2022-04-02 DIAGNOSIS — F411 Generalized anxiety disorder: Secondary | ICD-10-CM | POA: Diagnosis not present

## 2022-04-03 ENCOUNTER — Encounter: Payer: Self-pay | Admitting: Cardiovascular Disease

## 2022-04-03 ENCOUNTER — Ambulatory Visit: Payer: BC Managed Care – PPO | Attending: Cardiovascular Disease | Admitting: Cardiovascular Disease

## 2022-04-03 VITALS — BP 130/72 | HR 88 | Ht 69.0 in | Wt 190.8 lb

## 2022-04-03 DIAGNOSIS — E782 Mixed hyperlipidemia: Secondary | ICD-10-CM

## 2022-04-03 DIAGNOSIS — R931 Abnormal findings on diagnostic imaging of heart and coronary circulation: Secondary | ICD-10-CM

## 2022-04-03 MED ORDER — CLOPIDOGREL BISULFATE 75 MG PO TABS: 75.0000 mg | ORAL_TABLET | Freq: Every day | ORAL | 3 refills | Status: DC

## 2022-04-03 MED ORDER — ATORVASTATIN CALCIUM 40 MG PO TABS: 40.0000 mg | ORAL_TABLET | Freq: Every day | ORAL | 1 refills | Status: DC

## 2022-04-03 NOTE — Patient Instructions (Addendum)
Medication Instructions:  START Plavix 75 mg daily  INCREASE Lipitor 40 mg daily   *If you need a refill on your cardiac medications before your next appointment, please call your pharmacy*   Testing/Procedures:  Your physician has requested that you have an exercise tolerance test, this is a screening tool to track your fitness level. This test evaluates the your exercise capacity by measuring cardiovascular response to exercise, the stress response is induced by exercise (exercise-treadmill).  Graded exercise test is also known as maximal exercise test or stress EKG test  . Please also follow instruction sheet given.   Echocardiogram - Your physician has requested that you have an echocardiogram. Echocardiography is a painless test that uses sound waves to create images of your heart. It provides your doctor with information about the size and shape of your heart and how well your heart's chambers and valves are working. This procedure takes approximately one hour. There are no restrictions for this procedure.    Follow-Up: At [facility], you and your health needs are our priority.  As part of our continuing mission to provide you with exceptional heart care, we have created designated Provider Care Teams.  These Care Teams include your primary Cardiologist (physician) and Advanced Practice Providers (APPs -  Physician Assistants and Nurse Practitioners) who all work together to provide you with the care you need, when you need it.  We recommend signing up for the patient portal called "MyChart".  Sign up information is provided on this After Visit Summary.  MyChart is used to connect with patients for Virtual Visits (Telemedicine).  Patients are able to view lab/test results, encounter notes, upcoming appointments, etc.  Non-urgent messages can be sent to your provider as well.   To learn more about what you can do with MyChart, go to [email or web address].    Your next  appointment:   2 month(s)  Provider:   Sande Rives, PA-C or Almyra Deforest, PA-C    Then, None will plan to see you again in 12 month(s).

## 2022-04-05 DIAGNOSIS — J301 Allergic rhinitis due to pollen: Secondary | ICD-10-CM | POA: Diagnosis not present

## 2022-04-09 DIAGNOSIS — F411 Generalized anxiety disorder: Secondary | ICD-10-CM | POA: Diagnosis not present

## 2022-04-09 DIAGNOSIS — F331 Major depressive disorder, recurrent, moderate: Secondary | ICD-10-CM | POA: Diagnosis not present

## 2022-04-12 DIAGNOSIS — J301 Allergic rhinitis due to pollen: Secondary | ICD-10-CM | POA: Diagnosis not present

## 2022-04-16 DIAGNOSIS — F331 Major depressive disorder, recurrent, moderate: Secondary | ICD-10-CM | POA: Diagnosis not present

## 2022-04-16 DIAGNOSIS — F411 Generalized anxiety disorder: Secondary | ICD-10-CM | POA: Diagnosis not present

## 2022-04-19 ENCOUNTER — Telehealth (HOSPITAL_COMMUNITY): Payer: Self-pay | Admitting: *Deleted

## 2022-04-19 DIAGNOSIS — J301 Allergic rhinitis due to pollen: Secondary | ICD-10-CM | POA: Diagnosis not present

## 2022-04-19 NOTE — Telephone Encounter (Signed)
Reminder call and instructions given for upcoming ETT on 04/23/22 at 3:30.

## 2022-04-23 ENCOUNTER — Ambulatory Visit (HOSPITAL_COMMUNITY): Payer: BC Managed Care – PPO | Attending: Internal Medicine

## 2022-04-23 DIAGNOSIS — R931 Abnormal findings on diagnostic imaging of heart and coronary circulation: Secondary | ICD-10-CM | POA: Diagnosis not present

## 2022-04-23 LAB — EXERCISE TOLERANCE TEST
Angina Index: 0
Duke Treadmill Score: 10
Estimated workload: 11.8
Exercise duration (min): 10 min
Exercise duration (sec): 2 s
MPHR: 174 {beats}/min
Peak HR: 176 {beats}/min
Percent HR: 101 %
Rest HR: 73 {beats}/min
ST Depression (mm): 0 mm

## 2022-04-27 ENCOUNTER — Ambulatory Visit (HOSPITAL_COMMUNITY): Payer: BC Managed Care – PPO | Attending: Cardiovascular Disease

## 2022-04-27 DIAGNOSIS — R931 Abnormal findings on diagnostic imaging of heart and coronary circulation: Secondary | ICD-10-CM | POA: Diagnosis not present

## 2022-04-27 LAB — ECHOCARDIOGRAM COMPLETE
Area-P 1/2: 4.31 cm2
Calc EF: 55.7 %
S' Lateral: 3.2 cm
Single Plane A2C EF: 55.5 %
Single Plane A4C EF: 56.1 %

## 2022-04-30 DIAGNOSIS — F411 Generalized anxiety disorder: Secondary | ICD-10-CM | POA: Diagnosis not present

## 2022-04-30 DIAGNOSIS — F331 Major depressive disorder, recurrent, moderate: Secondary | ICD-10-CM | POA: Diagnosis not present

## 2022-05-03 DIAGNOSIS — J301 Allergic rhinitis due to pollen: Secondary | ICD-10-CM | POA: Diagnosis not present

## 2022-05-07 ENCOUNTER — Encounter: Payer: Self-pay | Admitting: Cardiovascular Disease

## 2022-05-10 DIAGNOSIS — J301 Allergic rhinitis due to pollen: Secondary | ICD-10-CM | POA: Diagnosis not present

## 2022-05-14 DIAGNOSIS — F411 Generalized anxiety disorder: Secondary | ICD-10-CM | POA: Diagnosis not present

## 2022-05-14 DIAGNOSIS — F331 Major depressive disorder, recurrent, moderate: Secondary | ICD-10-CM | POA: Diagnosis not present

## 2022-05-17 DIAGNOSIS — J301 Allergic rhinitis due to pollen: Secondary | ICD-10-CM | POA: Diagnosis not present

## 2022-05-23 DIAGNOSIS — H9319 Tinnitus, unspecified ear: Secondary | ICD-10-CM | POA: Diagnosis not present

## 2022-05-23 DIAGNOSIS — G478 Other sleep disorders: Secondary | ICD-10-CM | POA: Diagnosis not present

## 2022-05-23 DIAGNOSIS — G4751 Confusional arousals: Secondary | ICD-10-CM | POA: Diagnosis not present

## 2022-05-23 DIAGNOSIS — J45909 Unspecified asthma, uncomplicated: Secondary | ICD-10-CM | POA: Diagnosis not present

## 2022-05-23 DIAGNOSIS — G4733 Obstructive sleep apnea (adult) (pediatric): Secondary | ICD-10-CM | POA: Diagnosis not present

## 2022-05-23 DIAGNOSIS — R351 Nocturia: Secondary | ICD-10-CM | POA: Diagnosis not present

## 2022-05-23 DIAGNOSIS — G2581 Restless legs syndrome: Secondary | ICD-10-CM | POA: Diagnosis not present

## 2022-05-23 DIAGNOSIS — R0683 Snoring: Secondary | ICD-10-CM | POA: Diagnosis not present

## 2022-05-23 DIAGNOSIS — R6889 Other general symptoms and signs: Secondary | ICD-10-CM | POA: Diagnosis not present

## 2022-05-23 DIAGNOSIS — M26629 Arthralgia of temporomandibular joint, unspecified side: Secondary | ICD-10-CM | POA: Diagnosis not present

## 2022-05-23 DIAGNOSIS — R61 Generalized hyperhidrosis: Secondary | ICD-10-CM | POA: Diagnosis not present

## 2022-05-23 DIAGNOSIS — H9190 Unspecified hearing loss, unspecified ear: Secondary | ICD-10-CM | POA: Diagnosis not present

## 2022-05-23 DIAGNOSIS — R400 Somnolence: Secondary | ICD-10-CM | POA: Diagnosis not present

## 2022-05-23 DIAGNOSIS — F513 Sleepwalking [somnambulism]: Secondary | ICD-10-CM | POA: Diagnosis not present

## 2022-05-24 DIAGNOSIS — J301 Allergic rhinitis due to pollen: Secondary | ICD-10-CM | POA: Diagnosis not present

## 2022-05-29 DIAGNOSIS — J301 Allergic rhinitis due to pollen: Secondary | ICD-10-CM | POA: Diagnosis not present

## 2022-06-02 NOTE — Progress Notes (Signed)
Cardiology Office Note:    Date:  06/04/2022   ID:  Matthew Lawrence, DOB February 23, 1975, MRN [id number]  PCP:  Matthew Has, MD  Cardiologist:  Matthew Harps, MD  Electrophysiologist:  None   Referring MD: Matthew Has, MD   Chief Complaint: follow-up of elevated coronary calcium score and hyperlipidemia  History of Present Illness:    Matthew Lawrence is a 47 y.o. male with a history of elevated coronary calcium score of 387 in 02/2022 with negative ETT in 04/2022, hyperlipidemia, hypothyroidism, GERD, and obstructive sleep apnea who is followed by Dr. Flora Lawrence and presents today for follow-up of elevated coronary calcium score and hyperlipidemia.  Patient was referred to Dr. Flora Lawrence in 03/2022 for further evaluation of an elevated coronary calcium score. PCP ordered a coronary calcium score in 02/2022 and it came back at elevated at 387 (99th percentile for age and sex). He denied any chest pain or dyspnea at initial visit with Dr. Flora Lawrence. ETT and Echo were ordered for further evaluation. He was started on Plavix given allergy to salicylates and his Lipitor was increased. ETT was normal. Echo showed LVEF of 60-65% with normal wall motion and diastolic parameters and no significant valvular disease.   Patient presents today for follow-up. He Lawrence done very well since last visit. He denies any chest pain, shortness of breath, orthopnea, PND, or edema. He reports some palpitations that he describes as "skipping beats" but no significant/ sustained palpitations. He also states these palpitations have been less prevalent over the last couple of weeks. No lightheadedness, dizziness, or syncope. He states he had a sleep study a couple of weeks ago and it showed moderate sleep apnea so he is scheduled to go back for CPAP titration. This is being managed by Neurology.  Past Medical History:  Diagnosis Date   Allergic rhinitis    Anxiety    Facial pain    left-side   GERD (gastroesophageal reflux disease)     H/O chest pain    Cardionet, Palps   H/O sinus tachycardia    Cardionet   Hypertension    Hypothyroidism    Sleep apnea    He does not use a CPAP machine.    Past Surgical History:  Procedure Laterality Date   COLONOSCOPY     NASAL SINUS SURGERY     none      Current Medications: Current Meds  Medication Sig   acetaminophen (TYLENOL) 325 MG tablet 325 mg every 4 (four) hours as needed for mild pain.   albuterol (VENTOLIN HFA) 108 (90 Base) MCG/ACT inhaler INHALE 1 TO 2 PUFFS EVERY 4 HOURS AS NEEDED   atorvastatin (LIPITOR) 40 MG tablet Take 1 tablet (40 mg total) by mouth daily.   clopidogrel (PLAVIX) 75 MG tablet Take 1 tablet (75 mg total) by mouth daily.   EPINEPHrine 0.3 mg/0.3 mL IJ SOAJ injection INJECT 0.3 ML INTO THIGH ONCE FOR SEVERE ALLERGY REACTION   gentamicin ointment (GARAMYCIN) 0.1 % Apply 1 Application topically 3 (three) times daily.   hydrOXYzine (ATARAX) 50 MG tablet Take 50 mg by mouth 3 (three) times daily as needed for anxiety.   Iron-Vitamin C (VITRON-C) 65-125 MG TABS Take by mouth.   levocetirizine (XYZAL) 5 MG tablet Take 2.5 mg by mouth every evening.   levothyroxine (SYNTHROID, LEVOTHROID) 100 MCG tablet TK 1 T PO  QD   mometasone (NASONEX) 50 MCG/ACT nasal spray Place 2 sprays into the nose daily.   Omega-3 Fatty Acids (FISH OIL) 1000  MG CAPS 2 capsules Orally Once a day   sertraline (ZOLOFT) 25 MG tablet Take 12.5 mg by mouth daily.     Allergies:   Gadolinium derivatives, Iodinated contrast media, Sulfa antibiotics, and Sulfamethoxazole-trimethoprim   Social History   Socioeconomic History   Marital status: Married    Spouse name: Not on file   Number of children: 1   Years of education: Bachelors+   Highest education level: Not on file  Occupational History   Occupation: Clinical cytogeneticist   Occupation: [employer]  Tobacco Use   Smoking status: Former    Types: E-cigarettes    Quit date: 12/13/2020    Years since quitting: 1.4    Smokeless tobacco: Not on file   Tobacco comments:    Quit smoking cigarettes 10+ years ago.  Substance and Sexual Activity   Alcohol use: Yes    Alcohol/week: 0.0 standard drinks of alcohol    Comment: Occasional use.   Drug use: Not Currently    Frequency: 1.0 times per week    Comment: Occasional use.   Sexual activity: Not on file  Other Topics Concern   Not on file  Social History Narrative   Lives at home with fiance, Matthew Lawrence.   Right-handed.   2 cups caffeine use.   Social Determinants of Health   Financial Resource Strain: Not on file  Food Insecurity: Not on file  Transportation Needs: Not on file  Physical Activity: Not on file  Stress: Not on file  Social Connections: Not on file     Family History: The patient's family history includes Cancer in his maternal grandfather; Emphysema in his paternal grandfather; Healthy in his father and mother.  ROS:   Please see the history of present illness.     EKGs/Labs/Other Studies Reviewed:    The following studies were reviewed:  CT Cardiac Scoring 03/07/2022: Impressions: 1. Coronary calcium score of 387. This was 99th percentile for age-, race-, and sex-matched controls. _______________  Exercise Tolerance Test 04/23/2022:   Normal ETT   No ST deviation was noted. _______________  Echocardiogram 04/27/2022: Impressions:  1. Left ventricular ejection fraction, by estimation, is 60 to 65%. The  left ventricle Lawrence normal function. The left ventricle Lawrence no regional  wall motion abnormalities. Left ventricular diastolic parameters were  normal. The average left ventricular  global longitudinal strain is -20.0 %. The global longitudinal strain is  normal.   2. Right ventricular systolic function is normal. The right ventricular  size is normal. There is normal pulmonary artery systolic pressure.   3. Trivial mitral valve regurgitation.   4. The aortic valve is grossly normal. Aortic valve regurgitation is not   visualized.   5. The inferior vena cava is normal in size with greater than 50%  respiratory variability, suggesting right atrial pressure of 3 mmHg.   EKG:  EKG ordered today. EKG personally reviewed and demonstrates normal sinus rhythm, rate 84 bpm, with no acute ST/T changes. Normal axis. Normal PR and QRS intervals. QTc 415 ms.  Recent Labs: No results found for requested labs within last 365 days.  Recent Lipid Panel No results found for: "CHOL", "TRIG", "HDL", "CHOLHDL", "VLDL", "LDLCALC", "LDLDIRECT"  Physical Exam:    Vital Signs: BP 128/82   Pulse (!) 102   Ht 5\' 8"  (1.727 m)   Wt 198 lb 12.8 oz (90.2 kg)   SpO2 96%   BMI 30.23 kg/m     Wt Readings from Last 3 Encounters:  06/04/22 198  lb 12.8 oz (90.2 kg)  04/23/22 190 lb (86.2 kg)  04/03/22 190 lb 12.8 oz (86.5 kg)     General: 47 y.o. Caucasian male in no acute distress. HEENT: Normocephalic and atraumatic. Sclera clear.  Neck: Supple. No carotid bruits. No JVD. Heart: RRR. Distinct S1 and S2. No murmurs, gallops, or rubs. Radial pulses 2+ and equal bilaterally. Lungs: No increased work of breathing. Clear to ausculation bilaterally. No wheezes, rhonchi, or rales.  Abdomen: Soft, non-distended, and non-tender to palpation.  Extremities: No lower extremity edema.    Skin: Warm and dry. Neuro: Alert and oriented x3. No focal deficits. Psych: Normal affect. Responds appropriately.   Assessment:    1. Coronary artery calcification   2. Hyperlipidemia, unspecified hyperlipidemia type   3. Obstructive sleep apnea   4. Medication management     Plan:    Coronary Artery Calcifications  Coronary calcium score in 02/2022 was 387 (99th percentile for age and sex). ETT in 04/2022 was negative for ischemia and Echo showed normal LV function. - No chest pain or anginal symptoms.  - Continue Plavix 75mg  daily given allergy to salicylates. - Continue high-intensity statin. - Will check CBC when he comes back for  fasting labs.  Hyperlipidemia Lipid panel in 02/2022: Total cholesterol 254, Triglycerides 129, HDL 44. LDL 186. LDL goal <70.  - Lipitor was increased to 40mg  daily at last visit in 03/2022. Continue.  - He is not fasting today so he will come back repeat lipid panel and LFTs within the next couple of weeks.  Obstructive Sleep Apnea He states he recently was diagnosed with moderate sleep apnea and will be going back for CPAP titration soon. This is being managed by Neurology.  Disposition: Follow up in 1 year.   Medication Adjustments/Labs and Tests Ordered: Current medicines are reviewed at length with the patient today.  Concerns regarding medicines are outlined above.  Orders Placed This Encounter  Procedures   Lipid panel   CBC   Hepatic function panel   EKG 12-Lead   No orders of the defined types were placed in this encounter.   Patient Instructions  Medication Instructions:   Your physician recommends that you continue on your current medications as directed. Please refer to the Current Medication list given to you today.  *If you need a refill on your cardiac medications before your next appointment, please call your pharmacy*  Lab Work: Your physician recommends that you return for lab work in:  CBC Fasting Lipid Panel-DO NOT eat or drink past midnight. Okay to have water and/or black coffee only the morning of lab work.  Hepatic (Liver) Function Test  If you have labs (blood work) drawn today and your tests are completely normal, you will receive your results only by: MyChart Message (if you have MyChart) OR A paper copy in the mail If you have any lab test that is abnormal or we need to change your treatment, we will call you to review the results.  Testing/Procedures: NONE ordered at this time of appointment   Follow-Up: At [facility], you and your health needs are our priority.  As part of our continuing mission to provide you with exceptional  heart care, we have created designated Provider Care Teams.  These Care Teams include your primary Cardiologist (physician) and Advanced Practice Providers (APPs -  Physician Assistants and Nurse Practitioners) who all work together to provide you with the care you need, when you need it.  Your next appointment:  1 year(s)  Provider:   Reatha Harps, MD  or Marjie Skiff, PA-C        Other Instructions     Signed, Corrin Parker, PA-C  06/04/2022 5:34 PM    [facility]

## 2022-06-04 ENCOUNTER — Ambulatory Visit: Payer: BC Managed Care – PPO | Attending: Physician Assistant | Admitting: Student

## 2022-06-04 ENCOUNTER — Encounter: Payer: Self-pay | Admitting: Student

## 2022-06-04 VITALS — BP 128/82 | HR 84 | Ht 68.0 in | Wt 198.8 lb

## 2022-06-04 DIAGNOSIS — I2584 Coronary atherosclerosis due to calcified coronary lesion: Secondary | ICD-10-CM | POA: Diagnosis not present

## 2022-06-04 DIAGNOSIS — G4733 Obstructive sleep apnea (adult) (pediatric): Secondary | ICD-10-CM

## 2022-06-04 DIAGNOSIS — E785 Hyperlipidemia, unspecified: Secondary | ICD-10-CM

## 2022-06-04 DIAGNOSIS — Z79899 Other long term (current) drug therapy: Secondary | ICD-10-CM

## 2022-06-04 DIAGNOSIS — I2510 Atherosclerotic heart disease of native coronary artery without angina pectoris: Secondary | ICD-10-CM

## 2022-06-04 NOTE — Patient Instructions (Addendum)
Medication Instructions:   Your physician recommends that you continue on your current medications as directed. Please refer to the Current Medication list given to you today.  *If you need a refill on your cardiac medications before your next appointment, please call your pharmacy*  Lab Work: Your physician recommends that you return for lab work in:  CBC Fasting Lipid Panel-DO NOT eat or drink past midnight. Okay to have water and/or black coffee only the morning of lab work.  Hepatic (Liver) Function Test  If you have labs (blood work) drawn today and your tests are completely normal, you will receive your results only by: MyChart Message (if you have MyChart) OR A paper copy in the mail If you have any lab test that is abnormal or we need to change your treatment, we will call you to review the results.  Testing/Procedures: NONE ordered at this time of appointment   Follow-Up: At [facility], you and your health needs are our priority.  As part of our continuing mission to provide you with exceptional heart care, we have created designated Provider Care Teams.  These Care Teams include your primary Cardiologist (physician) and Advanced Practice Providers (APPs -  Physician Assistants and Nurse Practitioners) who all work together to provide you with the care you need, when you need it.  Your next appointment:   1 year(s)  Provider:   Reatha Harps, MD  or Marjie Skiff, PA-C        Other Instructions

## 2022-06-06 ENCOUNTER — Ambulatory Visit: Payer: No Typology Code available for payment source | Admitting: Physician Assistant

## 2022-06-07 DIAGNOSIS — J301 Allergic rhinitis due to pollen: Secondary | ICD-10-CM | POA: Diagnosis not present

## 2022-06-08 DIAGNOSIS — M79671 Pain in right foot: Secondary | ICD-10-CM | POA: Diagnosis not present

## 2022-06-14 DIAGNOSIS — J301 Allergic rhinitis due to pollen: Secondary | ICD-10-CM | POA: Diagnosis not present

## 2022-06-19 DIAGNOSIS — M79671 Pain in right foot: Secondary | ICD-10-CM | POA: Diagnosis not present

## 2022-06-21 DIAGNOSIS — J301 Allergic rhinitis due to pollen: Secondary | ICD-10-CM | POA: Diagnosis not present

## 2022-06-28 DIAGNOSIS — J301 Allergic rhinitis due to pollen: Secondary | ICD-10-CM | POA: Diagnosis not present

## 2022-07-05 DIAGNOSIS — J301 Allergic rhinitis due to pollen: Secondary | ICD-10-CM | POA: Diagnosis not present

## 2022-07-10 DIAGNOSIS — G4733 Obstructive sleep apnea (adult) (pediatric): Secondary | ICD-10-CM | POA: Diagnosis not present

## 2022-07-10 DIAGNOSIS — R400 Somnolence: Secondary | ICD-10-CM | POA: Diagnosis not present

## 2022-07-12 DIAGNOSIS — J301 Allergic rhinitis due to pollen: Secondary | ICD-10-CM | POA: Diagnosis not present

## 2022-07-19 DIAGNOSIS — J301 Allergic rhinitis due to pollen: Secondary | ICD-10-CM | POA: Diagnosis not present

## 2022-07-25 DIAGNOSIS — R400 Somnolence: Secondary | ICD-10-CM | POA: Diagnosis not present

## 2022-07-25 DIAGNOSIS — G4733 Obstructive sleep apnea (adult) (pediatric): Secondary | ICD-10-CM | POA: Diagnosis not present

## 2022-07-26 DIAGNOSIS — J301 Allergic rhinitis due to pollen: Secondary | ICD-10-CM | POA: Diagnosis not present

## 2022-07-26 DIAGNOSIS — E785 Hyperlipidemia, unspecified: Secondary | ICD-10-CM | POA: Diagnosis not present

## 2022-07-26 DIAGNOSIS — Z79899 Other long term (current) drug therapy: Secondary | ICD-10-CM | POA: Diagnosis not present

## 2022-07-27 LAB — CBC
Hematocrit: 41.1 % (ref 37.5–51.0)
Hemoglobin: 14.3 g/dL (ref 13.0–17.7)
MCH: 33.3 pg — ABNORMAL HIGH (ref 26.6–33.0)
MCHC: 34.8 g/dL (ref 31.5–35.7)
MCV: 96 fL (ref 79–97)
Platelets: 259 10*3/uL (ref 150–450)
RBC: 4.3 x10E6/uL (ref 4.14–5.80)
RDW: 12.8 % (ref 11.6–15.4)
WBC: 5.5 10*3/uL (ref 3.4–10.8)

## 2022-07-27 LAB — HEPATIC FUNCTION PANEL
ALT: 27 IU/L (ref 0–44)
AST: 26 IU/L (ref 0–40)
Albumin: 4.9 g/dL (ref 4.1–5.1)
Alkaline Phosphatase: 72 IU/L (ref 44–121)
Bilirubin Total: 0.4 mg/dL (ref 0.0–1.2)
Bilirubin, Direct: 0.14 mg/dL (ref 0.00–0.40)
Total Protein: 7.8 g/dL (ref 6.0–8.5)

## 2022-07-27 LAB — LIPID PANEL
Chol/HDL Ratio: 3.3 ratio (ref 0.0–5.0)
Cholesterol, Total: 116 mg/dL (ref 100–199)
HDL: 35 mg/dL — ABNORMAL LOW (ref >39)
LDL Chol Calc (NIH): 63 mg/dL (ref 0–99)
Triglycerides: 91 mg/dL (ref 0–149)
VLDL Cholesterol Cal: 18 mg/dL (ref 5–40)

## 2022-08-02 DIAGNOSIS — J301 Allergic rhinitis due to pollen: Secondary | ICD-10-CM | POA: Diagnosis not present

## 2022-08-03 DIAGNOSIS — L821 Other seborrheic keratosis: Secondary | ICD-10-CM | POA: Diagnosis not present

## 2022-08-03 DIAGNOSIS — L817 Pigmented purpuric dermatosis: Secondary | ICD-10-CM | POA: Diagnosis not present

## 2022-08-03 DIAGNOSIS — L814 Other melanin hyperpigmentation: Secondary | ICD-10-CM | POA: Diagnosis not present

## 2022-08-03 DIAGNOSIS — D225 Melanocytic nevi of trunk: Secondary | ICD-10-CM | POA: Diagnosis not present

## 2022-08-10 DIAGNOSIS — G4733 Obstructive sleep apnea (adult) (pediatric): Secondary | ICD-10-CM | POA: Diagnosis not present

## 2022-08-10 DIAGNOSIS — R400 Somnolence: Secondary | ICD-10-CM | POA: Diagnosis not present

## 2022-08-21 DIAGNOSIS — J301 Allergic rhinitis due to pollen: Secondary | ICD-10-CM | POA: Diagnosis not present

## 2022-08-22 DIAGNOSIS — I2510 Atherosclerotic heart disease of native coronary artery without angina pectoris: Secondary | ICD-10-CM | POA: Diagnosis not present

## 2022-08-22 DIAGNOSIS — R0981 Nasal congestion: Secondary | ICD-10-CM | POA: Diagnosis not present

## 2022-08-22 DIAGNOSIS — E785 Hyperlipidemia, unspecified: Secondary | ICD-10-CM | POA: Diagnosis not present

## 2022-08-22 DIAGNOSIS — E039 Hypothyroidism, unspecified: Secondary | ICD-10-CM | POA: Diagnosis not present

## 2022-08-23 DIAGNOSIS — J301 Allergic rhinitis due to pollen: Secondary | ICD-10-CM | POA: Diagnosis not present

## 2022-08-30 ENCOUNTER — Other Ambulatory Visit: Payer: Self-pay | Admitting: Family Medicine

## 2022-08-30 DIAGNOSIS — G4453 Primary thunderclap headache: Secondary | ICD-10-CM

## 2022-08-30 DIAGNOSIS — J301 Allergic rhinitis due to pollen: Secondary | ICD-10-CM | POA: Diagnosis not present

## 2022-08-31 ENCOUNTER — Ambulatory Visit
Admission: RE | Admit: 2022-08-31 | Discharge: 2022-08-31 | Disposition: A | Discharge status: Home / Self Care | Payer: BC Managed Care – PPO | Source: Ambulatory Visit | Attending: Family Medicine | Admitting: Family Medicine

## 2022-08-31 DIAGNOSIS — G4453 Primary thunderclap headache: Secondary | ICD-10-CM

## 2022-08-31 DIAGNOSIS — M436 Torticollis: Secondary | ICD-10-CM | POA: Diagnosis not present

## 2022-09-09 DIAGNOSIS — R400 Somnolence: Secondary | ICD-10-CM | POA: Diagnosis not present

## 2022-09-09 DIAGNOSIS — G4733 Obstructive sleep apnea (adult) (pediatric): Secondary | ICD-10-CM | POA: Diagnosis not present

## 2022-09-13 DIAGNOSIS — J301 Allergic rhinitis due to pollen: Secondary | ICD-10-CM | POA: Diagnosis not present

## 2022-09-24 ENCOUNTER — Other Ambulatory Visit: Payer: Self-pay | Admitting: Cardiovascular Disease

## 2022-09-27 DIAGNOSIS — J301 Allergic rhinitis due to pollen: Secondary | ICD-10-CM | POA: Diagnosis not present

## 2022-10-10 DIAGNOSIS — R400 Somnolence: Secondary | ICD-10-CM | POA: Diagnosis not present

## 2022-10-10 DIAGNOSIS — G4733 Obstructive sleep apnea (adult) (pediatric): Secondary | ICD-10-CM | POA: Diagnosis not present

## 2022-10-10 DIAGNOSIS — G4763 Sleep related bruxism: Secondary | ICD-10-CM | POA: Diagnosis not present

## 2022-10-10 DIAGNOSIS — G2581 Restless legs syndrome: Secondary | ICD-10-CM | POA: Diagnosis not present

## 2022-10-11 DIAGNOSIS — J301 Allergic rhinitis due to pollen: Secondary | ICD-10-CM | POA: Diagnosis not present

## 2022-10-18 DIAGNOSIS — J301 Allergic rhinitis due to pollen: Secondary | ICD-10-CM | POA: Diagnosis not present

## 2022-10-25 DIAGNOSIS — J301 Allergic rhinitis due to pollen: Secondary | ICD-10-CM | POA: Diagnosis not present

## 2022-11-01 DIAGNOSIS — J301 Allergic rhinitis due to pollen: Secondary | ICD-10-CM | POA: Diagnosis not present

## 2022-11-08 DIAGNOSIS — J301 Allergic rhinitis due to pollen: Secondary | ICD-10-CM | POA: Diagnosis not present

## 2022-11-15 DIAGNOSIS — J301 Allergic rhinitis due to pollen: Secondary | ICD-10-CM | POA: Diagnosis not present

## 2022-11-16 IMAGING — US US ABDOMEN LIMITED RUQ/ASCITES
1 series · 13 of 25 positions shown · non-contrast
Comparison: MRI abdomen 07/11/2014. Abdominal ultrasound
05/18/2014.

CLINICAL DATA: Right upper quadrant abdominal pain. Evaluate right
upper quadrant abdominal pain. Additional history provided by
scanning technologist: Patient reports symptoms for 3 months,
intermittent.

EXAM:
ULTRASOUND ABDOMEN LIMITED RIGHT UPPER QUADRANT

[Series 1: us abdomen limited ruq/ascites · 0.23mm/px · 13 of 62 slices shown]
[im 1/62]
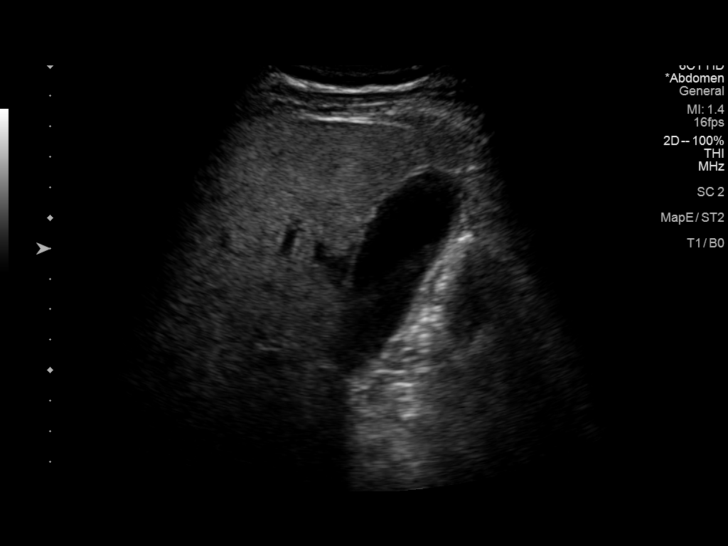
[im 6/62]
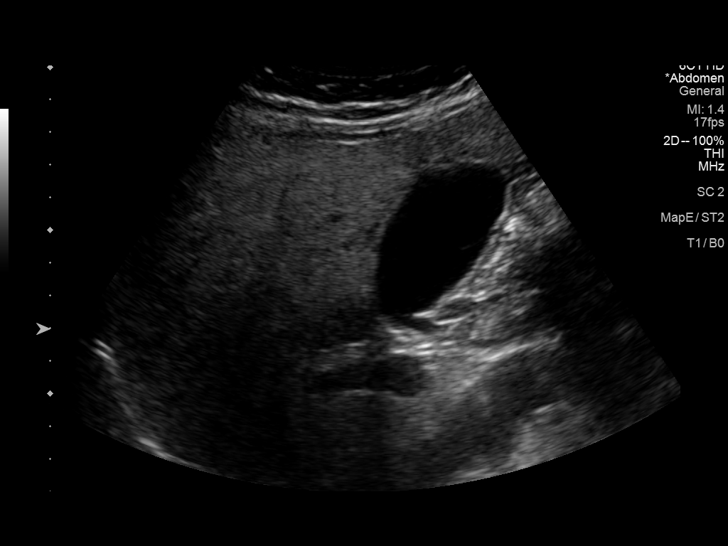
[im 11/62]
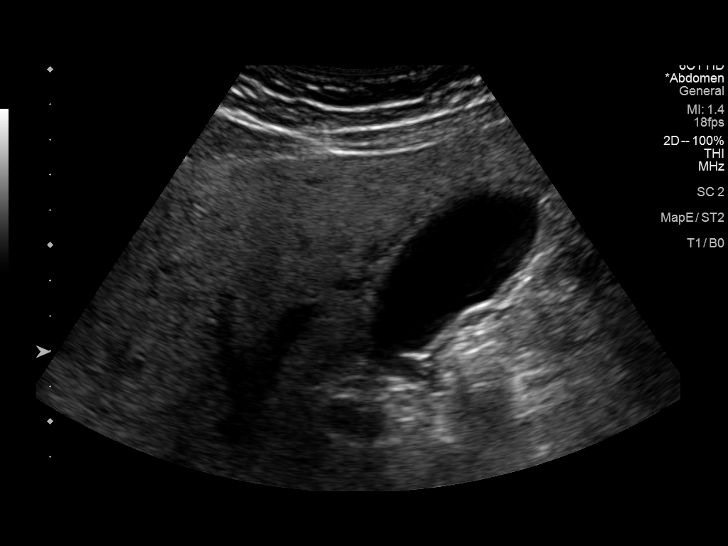
[im 16/62]
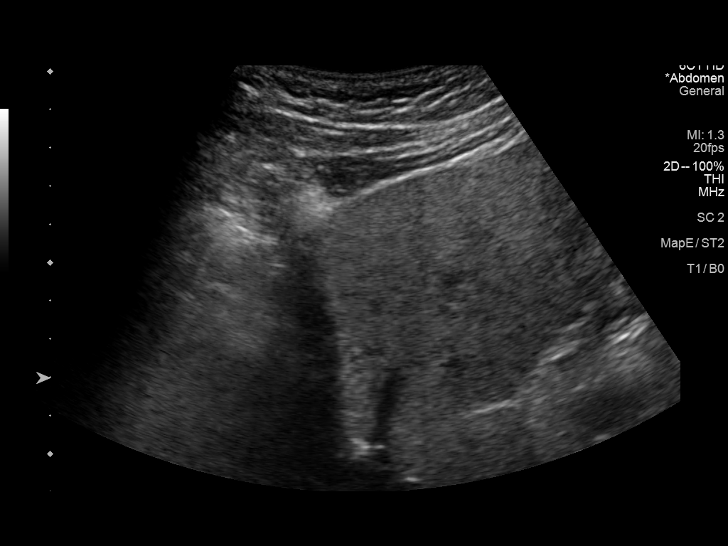
[im 21/62]
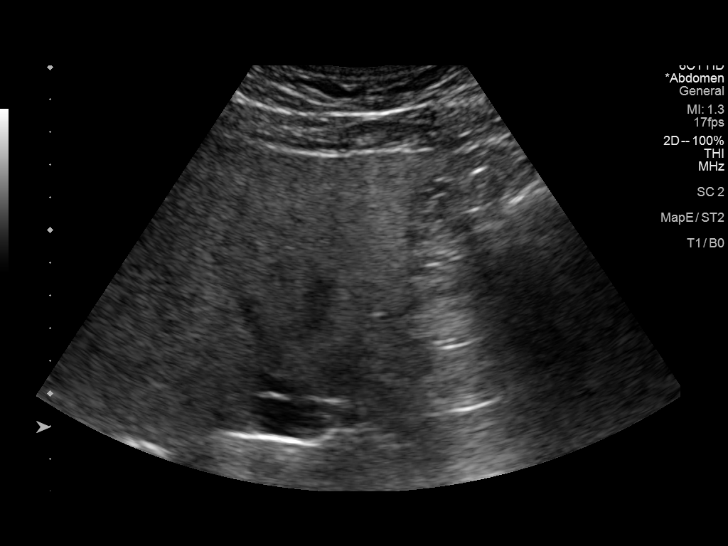
[im 26/62]
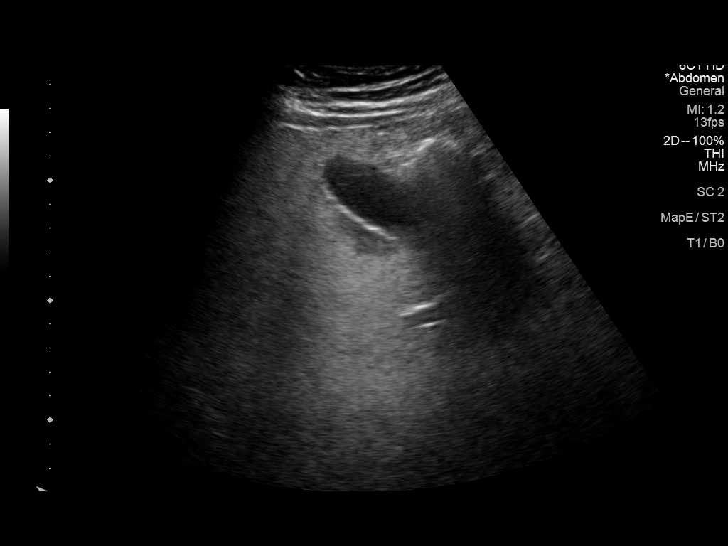
[im 31/62]
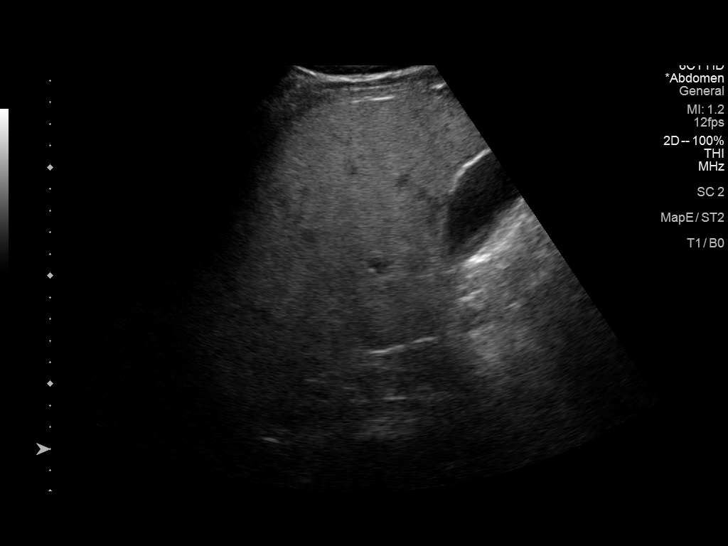
[im 36/62]
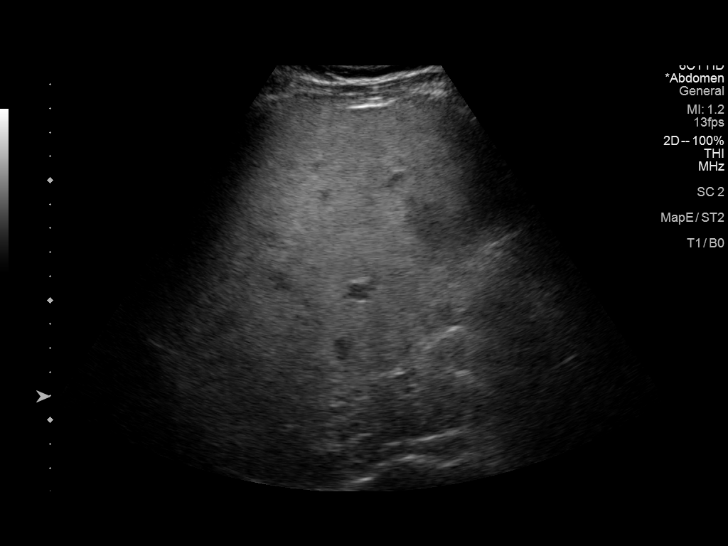
[im 41/62]
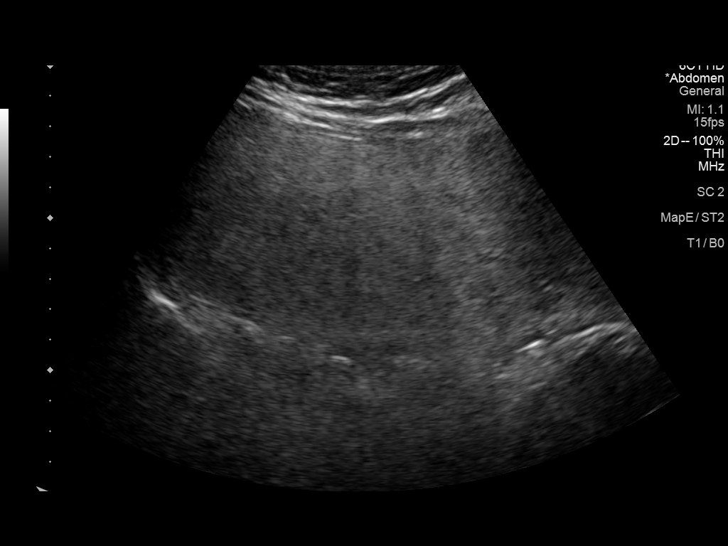
[im 46/62]
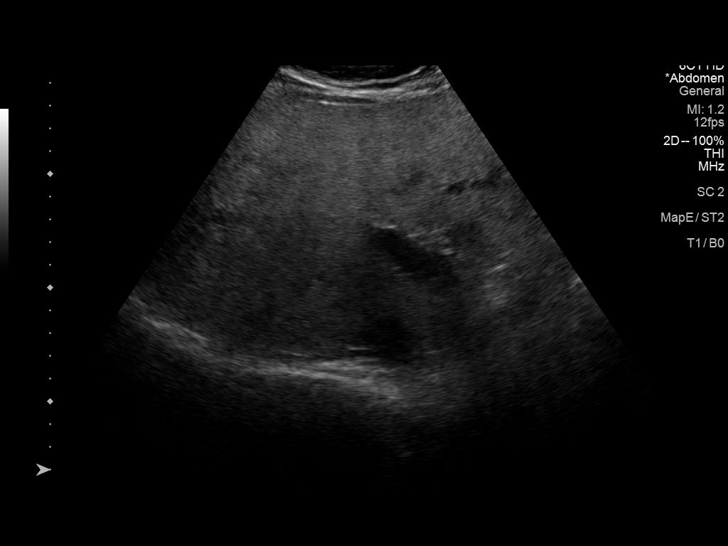
[im 51/62]
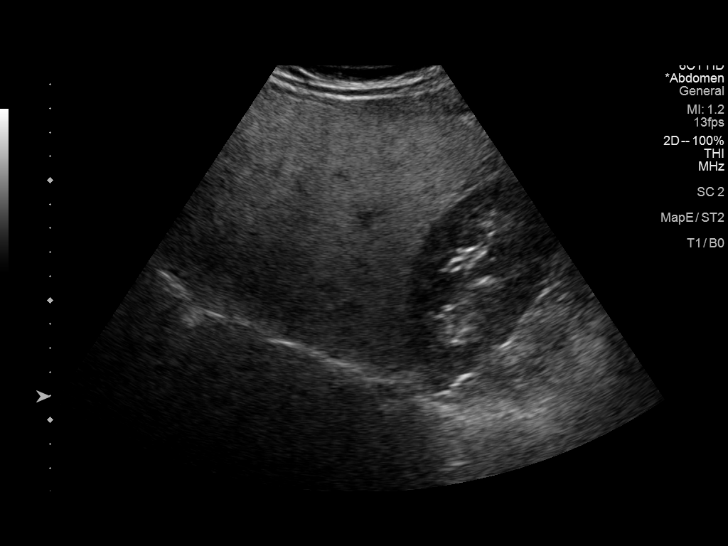
[im 56/62]
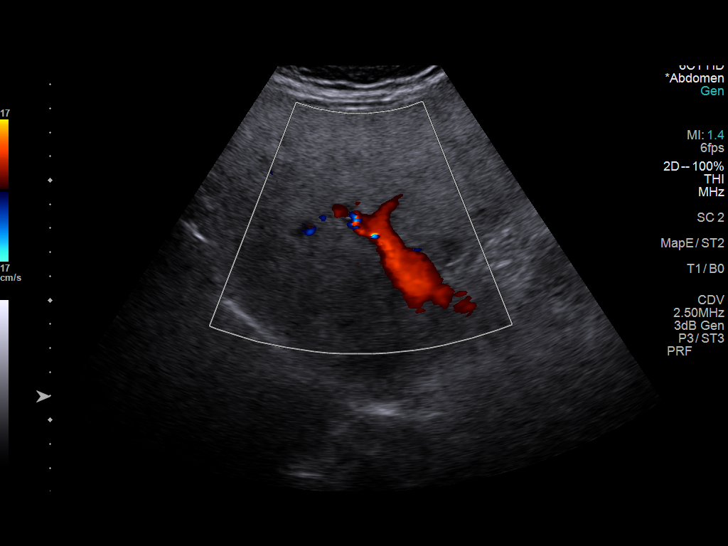
[im 62/62]
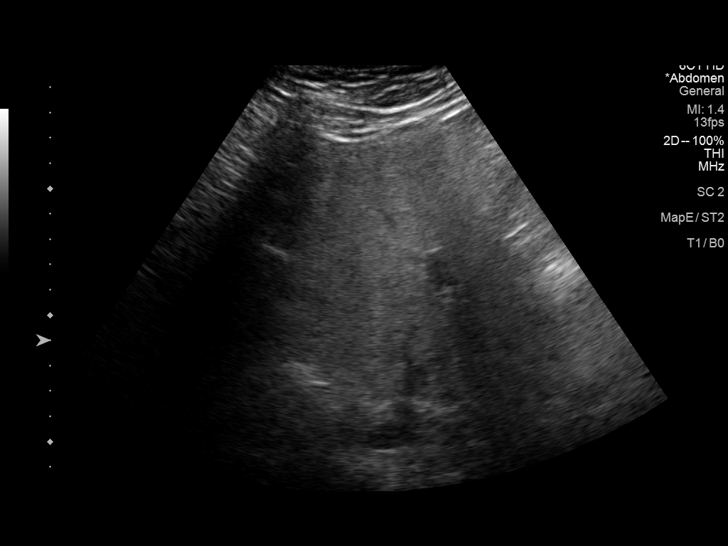

[13 of 25 positions shown; findings below may reference images not displayed]

FINDINGS: Gallbladder:

No gallstones or wall thickening visualized. No sonographic Murphy
sign noted by sonographer.

Common bile duct:

Diameter: 3-4 mm, within normal limits.

Liver:

Increased hepatic parenchymal echogenicity. There is relative
hypoechogenicity along portions of the gallbladder fossa, likely
reflecting focal fatty sparing as was demonstrated on the prior
abdominal MRI of 07/11/2014. Portal vein is patent on color Doppler
imaging with normal direction of blood flow towards the liver.
IMPRESSION: Increased hepatic parenchymal echogenicity with relative
hypoechogenicity along portions of the gallbladder fossa. Findings
are compatible with hepatic steatosis and focal fatty sparing, as
was demonstrated on the prior MRI abdomen 07/11/2014.

Otherwise unremarkable right upper quadrant ultrasound, as
described.

## 2022-11-22 DIAGNOSIS — J301 Allergic rhinitis due to pollen: Secondary | ICD-10-CM | POA: Diagnosis not present

## 2022-11-26 DIAGNOSIS — Z23 Encounter for immunization: Secondary | ICD-10-CM | POA: Diagnosis not present

## 2022-11-29 DIAGNOSIS — J301 Allergic rhinitis due to pollen: Secondary | ICD-10-CM | POA: Diagnosis not present

## 2022-12-13 DIAGNOSIS — J301 Allergic rhinitis due to pollen: Secondary | ICD-10-CM | POA: Diagnosis not present

## 2022-12-20 DIAGNOSIS — J301 Allergic rhinitis due to pollen: Secondary | ICD-10-CM | POA: Diagnosis not present

## 2022-12-20 DIAGNOSIS — E785 Hyperlipidemia, unspecified: Secondary | ICD-10-CM | POA: Diagnosis not present

## 2022-12-27 DIAGNOSIS — J301 Allergic rhinitis due to pollen: Secondary | ICD-10-CM | POA: Diagnosis not present

## 2022-12-31 DIAGNOSIS — R1032 Left lower quadrant pain: Secondary | ICD-10-CM | POA: Diagnosis not present

## 2023-01-03 DIAGNOSIS — J301 Allergic rhinitis due to pollen: Secondary | ICD-10-CM | POA: Diagnosis not present

## 2023-01-17 DIAGNOSIS — J301 Allergic rhinitis due to pollen: Secondary | ICD-10-CM | POA: Diagnosis not present

## 2023-01-30 DIAGNOSIS — J301 Allergic rhinitis due to pollen: Secondary | ICD-10-CM | POA: Diagnosis not present

## 2023-01-31 DIAGNOSIS — J301 Allergic rhinitis due to pollen: Secondary | ICD-10-CM | POA: Diagnosis not present

## 2023-02-11 DIAGNOSIS — G4733 Obstructive sleep apnea (adult) (pediatric): Secondary | ICD-10-CM | POA: Diagnosis not present

## 2023-02-11 DIAGNOSIS — R400 Somnolence: Secondary | ICD-10-CM | POA: Diagnosis not present

## 2023-02-14 DIAGNOSIS — J301 Allergic rhinitis due to pollen: Secondary | ICD-10-CM | POA: Diagnosis not present

## 2023-02-20 DIAGNOSIS — R7309 Other abnormal glucose: Secondary | ICD-10-CM | POA: Diagnosis not present

## 2023-02-20 DIAGNOSIS — E039 Hypothyroidism, unspecified: Secondary | ICD-10-CM | POA: Diagnosis not present

## 2023-02-20 DIAGNOSIS — E785 Hyperlipidemia, unspecified: Secondary | ICD-10-CM | POA: Diagnosis not present

## 2023-02-20 DIAGNOSIS — Z125 Encounter for screening for malignant neoplasm of prostate: Secondary | ICD-10-CM | POA: Diagnosis not present

## 2023-02-20 DIAGNOSIS — Z0000 Encounter for general adult medical examination without abnormal findings: Secondary | ICD-10-CM | POA: Diagnosis not present

## 2023-02-21 DIAGNOSIS — J301 Allergic rhinitis due to pollen: Secondary | ICD-10-CM | POA: Diagnosis not present

## 2023-03-07 DIAGNOSIS — J301 Allergic rhinitis due to pollen: Secondary | ICD-10-CM | POA: Diagnosis not present

## 2023-03-14 DIAGNOSIS — J301 Allergic rhinitis due to pollen: Secondary | ICD-10-CM | POA: Diagnosis not present

## 2023-03-21 DIAGNOSIS — M549 Dorsalgia, unspecified: Secondary | ICD-10-CM | POA: Diagnosis not present

## 2023-03-25 DIAGNOSIS — Z888 Allergy status to other drugs, medicaments and biological substances status: Secondary | ICD-10-CM | POA: Diagnosis not present

## 2023-03-25 DIAGNOSIS — M9901 Segmental and somatic dysfunction of cervical region: Secondary | ICD-10-CM | POA: Diagnosis not present

## 2023-03-25 DIAGNOSIS — M531 Cervicobrachial syndrome: Secondary | ICD-10-CM | POA: Diagnosis not present

## 2023-03-25 DIAGNOSIS — M9908 Segmental and somatic dysfunction of rib cage: Secondary | ICD-10-CM | POA: Diagnosis not present

## 2023-03-25 DIAGNOSIS — M9902 Segmental and somatic dysfunction of thoracic region: Secondary | ICD-10-CM | POA: Diagnosis not present

## 2023-03-26 DIAGNOSIS — M531 Cervicobrachial syndrome: Secondary | ICD-10-CM | POA: Diagnosis not present

## 2023-03-26 DIAGNOSIS — M9908 Segmental and somatic dysfunction of rib cage: Secondary | ICD-10-CM | POA: Diagnosis not present

## 2023-03-26 DIAGNOSIS — M9901 Segmental and somatic dysfunction of cervical region: Secondary | ICD-10-CM | POA: Diagnosis not present

## 2023-03-26 DIAGNOSIS — M9902 Segmental and somatic dysfunction of thoracic region: Secondary | ICD-10-CM | POA: Diagnosis not present

## 2023-03-27 DIAGNOSIS — M531 Cervicobrachial syndrome: Secondary | ICD-10-CM | POA: Diagnosis not present

## 2023-03-27 DIAGNOSIS — M9908 Segmental and somatic dysfunction of rib cage: Secondary | ICD-10-CM | POA: Diagnosis not present

## 2023-03-27 DIAGNOSIS — M9902 Segmental and somatic dysfunction of thoracic region: Secondary | ICD-10-CM | POA: Diagnosis not present

## 2023-03-27 DIAGNOSIS — M9901 Segmental and somatic dysfunction of cervical region: Secondary | ICD-10-CM | POA: Diagnosis not present

## 2023-03-28 DIAGNOSIS — J301 Allergic rhinitis due to pollen: Secondary | ICD-10-CM | POA: Diagnosis not present

## 2023-04-01 DIAGNOSIS — M9902 Segmental and somatic dysfunction of thoracic region: Secondary | ICD-10-CM | POA: Diagnosis not present

## 2023-04-01 DIAGNOSIS — M9908 Segmental and somatic dysfunction of rib cage: Secondary | ICD-10-CM | POA: Diagnosis not present

## 2023-04-01 DIAGNOSIS — M9901 Segmental and somatic dysfunction of cervical region: Secondary | ICD-10-CM | POA: Diagnosis not present

## 2023-04-01 DIAGNOSIS — M531 Cervicobrachial syndrome: Secondary | ICD-10-CM | POA: Diagnosis not present

## 2023-04-02 ENCOUNTER — Other Ambulatory Visit: Payer: Self-pay | Admitting: Cardiovascular Disease

## 2023-04-02 DIAGNOSIS — M9908 Segmental and somatic dysfunction of rib cage: Secondary | ICD-10-CM | POA: Diagnosis not present

## 2023-04-02 DIAGNOSIS — M9902 Segmental and somatic dysfunction of thoracic region: Secondary | ICD-10-CM | POA: Diagnosis not present

## 2023-04-02 DIAGNOSIS — M531 Cervicobrachial syndrome: Secondary | ICD-10-CM | POA: Diagnosis not present

## 2023-04-02 DIAGNOSIS — M9901 Segmental and somatic dysfunction of cervical region: Secondary | ICD-10-CM | POA: Diagnosis not present

## 2023-04-10 DIAGNOSIS — M9908 Segmental and somatic dysfunction of rib cage: Secondary | ICD-10-CM | POA: Diagnosis not present

## 2023-04-10 DIAGNOSIS — M9902 Segmental and somatic dysfunction of thoracic region: Secondary | ICD-10-CM | POA: Diagnosis not present

## 2023-04-10 DIAGNOSIS — M531 Cervicobrachial syndrome: Secondary | ICD-10-CM | POA: Diagnosis not present

## 2023-04-10 DIAGNOSIS — M9901 Segmental and somatic dysfunction of cervical region: Secondary | ICD-10-CM | POA: Diagnosis not present

## 2023-04-11 DIAGNOSIS — J301 Allergic rhinitis due to pollen: Secondary | ICD-10-CM | POA: Diagnosis not present

## 2023-04-17 DIAGNOSIS — M9902 Segmental and somatic dysfunction of thoracic region: Secondary | ICD-10-CM | POA: Diagnosis not present

## 2023-04-17 DIAGNOSIS — M542 Cervicalgia: Secondary | ICD-10-CM | POA: Diagnosis not present

## 2023-04-17 DIAGNOSIS — M9908 Segmental and somatic dysfunction of rib cage: Secondary | ICD-10-CM | POA: Diagnosis not present

## 2023-04-17 DIAGNOSIS — M531 Cervicobrachial syndrome: Secondary | ICD-10-CM | POA: Diagnosis not present

## 2023-04-17 DIAGNOSIS — M9901 Segmental and somatic dysfunction of cervical region: Secondary | ICD-10-CM | POA: Diagnosis not present

## 2023-04-17 DIAGNOSIS — J301 Allergic rhinitis due to pollen: Secondary | ICD-10-CM | POA: Diagnosis not present

## 2023-04-18 DIAGNOSIS — J301 Allergic rhinitis due to pollen: Secondary | ICD-10-CM | POA: Diagnosis not present

## 2023-04-22 DIAGNOSIS — M9901 Segmental and somatic dysfunction of cervical region: Secondary | ICD-10-CM | POA: Diagnosis not present

## 2023-04-22 DIAGNOSIS — M531 Cervicobrachial syndrome: Secondary | ICD-10-CM | POA: Diagnosis not present

## 2023-04-22 DIAGNOSIS — M9902 Segmental and somatic dysfunction of thoracic region: Secondary | ICD-10-CM | POA: Diagnosis not present

## 2023-04-22 DIAGNOSIS — M9908 Segmental and somatic dysfunction of rib cage: Secondary | ICD-10-CM | POA: Diagnosis not present

## 2023-04-23 DIAGNOSIS — J309 Allergic rhinitis, unspecified: Secondary | ICD-10-CM | POA: Diagnosis not present

## 2023-04-23 DIAGNOSIS — T7840XA Allergy, unspecified, initial encounter: Secondary | ICD-10-CM | POA: Diagnosis not present

## 2023-04-25 DIAGNOSIS — M5412 Radiculopathy, cervical region: Secondary | ICD-10-CM | POA: Diagnosis not present

## 2023-04-25 DIAGNOSIS — J301 Allergic rhinitis due to pollen: Secondary | ICD-10-CM | POA: Diagnosis not present

## 2023-05-02 DIAGNOSIS — J301 Allergic rhinitis due to pollen: Secondary | ICD-10-CM | POA: Diagnosis not present

## 2023-05-06 ENCOUNTER — Encounter: Payer: Self-pay | Admitting: Nurse Practitioner

## 2023-05-06 ENCOUNTER — Ambulatory Visit: Attending: Nurse Practitioner | Admitting: Nurse Practitioner

## 2023-05-06 VITALS — BP 128/90 | HR 74 | Ht 69.0 in | Wt 200.0 lb

## 2023-05-06 DIAGNOSIS — E785 Hyperlipidemia, unspecified: Secondary | ICD-10-CM

## 2023-05-06 DIAGNOSIS — R030 Elevated blood-pressure reading, without diagnosis of hypertension: Secondary | ICD-10-CM | POA: Diagnosis not present

## 2023-05-06 DIAGNOSIS — G4733 Obstructive sleep apnea (adult) (pediatric): Secondary | ICD-10-CM | POA: Diagnosis not present

## 2023-05-06 DIAGNOSIS — I2510 Atherosclerotic heart disease of native coronary artery without angina pectoris: Secondary | ICD-10-CM | POA: Diagnosis not present

## 2023-05-06 NOTE — Patient Instructions (Addendum)
 Medication Instructions:  Your physician recommends that you continue on your current medications as directed. Please refer to the Current Medication list given to you today.  *If you need a refill on your cardiac medications before your next appointment, please call your pharmacy*   Follow-Up: At [facility], you and your health needs are our priority.  As part of our continuing mission to provide you with exceptional heart care, we have created designated Provider Care Teams.  These Care Teams include your primary Cardiologist (physician) and Advanced Practice Providers (APPs -  Physician Assistants and Nurse Practitioners) who all work together to provide you with the care you need, when you need it.  Your next appointment:   12 month(s)  Provider:   Reatha Harps, MD     Other Instructions Monitor Blood pressure: report if consistently >130/80   Bernadene Person NP has recommended the use of an at home blood pressure machine. The recommended brand is Omron, which can be purchased at your local pharmacy, [employer], [employer] and/or [employer]. You may also contact the number on the back of your insurance card to see if they may cover a blood pressure cuff and this particular brand   HOW TO TAKE YOUR BLOOD PRESSURE: Rest 5 minutes before taking your blood pressure. Don't smoke or drink caffeinated beverages for at least 30 minutes before. Take your blood pressure before (not after) you eat. Sit comfortably with your back supported and both feet on the floor (don't cross your legs). Elevate your arm to heart level on a table or a desk. Use the proper sized cuff. It should fit smoothly and snugly around your bare upper arm. There should be enough room to slip a fingertip under the cuff. The bottom edge of the cuff should be 1 inch above the crease of the elbow. Ideally, take 3 measurements at one sitting and record the average.

## 2023-05-06 NOTE — Progress Notes (Signed)
 Office Visit    Patient Name: Matthew Lawrence Date of Encounter: 05/06/2023  Primary Care Provider:  Farris Has, MD Primary Cardiologist:  Reatha Harps, MD  Chief Complaint    48 year old male with a history of elevated coronary calcium score, hyperlipidemia, hypothyroidism, OSA, and GERD who presents for follow-up related to coronary artery calcification.  Past Medical History    Past Medical History:  Diagnosis Date   Allergic rhinitis    Anxiety    Facial pain    left-side   GERD (gastroesophageal reflux disease)    H/O chest pain    Cardionet, Palps   H/O sinus tachycardia    Cardionet   Hypertension    Hypothyroidism    Sleep apnea    He does not use a CPAP machine.   Past Surgical History:  Procedure Laterality Date   COLONOSCOPY     NASAL SINUS SURGERY     none      Allergies  Allergies  Allergen Reactions   Gadolinium Derivatives Nausea Only, Other (See Comments) and Cough    Blotchy behind ears, sneezing and extremely stuffy. Chest pain. Given 50 mg benadryl by mouth and observed.    Iodinated Contrast Media Cough, Nausea Only and Other (See Comments)    Blotchy behind ears, sneezing and extremely stuffy. Chest pain. Given 50 mg benadryl by mouth and observed.   Blotchy behind ears, sneezing and extremely stuffy. Chest pain. Given 50 mg benadryl by mouth and observed.   Blotchy behind ears, sneezing and extremely stuffy. Chest pain. Given 50 mg benadryl by mouth and observed.   Blotchy behind ears, sneezing and extremely stuffy. Chest pain. Given 50 mg benadryl by mouth and observed.   Nsaids    Sulfa Antibiotics Rash   Sulfamethoxazole-Trimethoprim Rash    Mild Rash on arms     Labs/Other Studies Reviewed    The following studies were reviewed today:  Cardiac Studies & Procedures   ______________________________________________________________________________________________   STRESS TESTS  EXERCISE TOLERANCE TEST (ETT)  04/23/2022  Narrative   Normal ETT   No ST deviation was noted.   ECHOCARDIOGRAM  ECHOCARDIOGRAM COMPLETE 04/27/2022  Narrative ECHOCARDIOGRAM REPORT    Patient Name:   JOHNATHON OLDEN  Date of Exam: 04/27/2022 Medical Rec #:  [id number]     Height:       69.0 in Accession #:    [id number]    Weight:       190.0 lb Date of Birth:  04-27-1975    BSA:          2.021 m Patient Age:    46 years      BP:           130/72 mmHg Patient Gender: M             HR:           68 bpm. Exam Location:  [address]  Procedure: 2D Echo, 3D Echo, Cardiac Doppler, Color Doppler and Strain Analysis  Indications:    Elevated coronary artery calcium score [[id number]]  History:        Patient has prior history of Echocardiogram examinations, most recent 06/26/2012. Arrythmias:Tachycardia, Signs/Symptoms:Chest Pain; Risk Factors:GERD, Hypertension and Sleep Apnea.  Sonographer:    Eulah Pont RDCS Referring Phys: [id number] Ronnald Ramp O'NEAL   Sonographer Comments: Global longitudinal strain was attempted. IMPRESSIONS   1. Left ventricular ejection fraction, by estimation, is 60 to 65%. The left ventricle has normal function. The left ventricle has no  regional wall motion abnormalities. Left ventricular diastolic parameters were normal. The average left ventricular global longitudinal strain is -20.0 %. The global longitudinal strain is normal. 2. Right ventricular systolic function is normal. The right ventricular size is normal. There is normal pulmonary artery systolic pressure. 3. Trivial mitral valve regurgitation. 4. The aortic valve is grossly normal. Aortic valve regurgitation is not visualized. 5. The inferior vena cava is normal in size with greater than 50% respiratory variability, suggesting right atrial pressure of 3 mmHg.  FINDINGS Left Ventricle: Left ventricular ejection fraction, by estimation, is 60 to 65%. The left ventricle has normal function. The left ventricle has no  regional wall motion abnormalities. The average left ventricular global longitudinal strain is -20.0 %. The global longitudinal strain is normal. The left ventricular internal cavity size was normal in size. There is no left ventricular hypertrophy. Left ventricular diastolic parameters were normal.  Right Ventricle: The right ventricular size is normal. Right ventricular systolic function is normal. There is normal pulmonary artery systolic pressure. The tricuspid regurgitant velocity is 2.26 m/s, and with an assumed right atrial pressure of 3 mmHg, the estimated right ventricular systolic pressure is 23.4 mmHg.  Left Atrium: Left atrial size was normal in size.  Right Atrium: Right atrial size was normal in size.  Pericardium: There is no evidence of pericardial effusion.  Mitral Valve: Trivial mitral valve regurgitation.  Tricuspid Valve: Tricuspid valve regurgitation is mild.  Aortic Valve: The aortic valve is grossly normal. Aortic valve regurgitation is not visualized.  Pulmonic Valve: Pulmonic valve regurgitation is not visualized.  Aorta: The aortic root and ascending aorta are structurally normal, with no evidence of dilitation.  Venous: The inferior vena cava is normal in size with greater than 50% respiratory variability, suggesting right atrial pressure of 3 mmHg.  IAS/Shunts: No atrial level shunt detected by color flow Doppler.   LEFT VENTRICLE PLAX 2D LVIDd:         4.60 cm      Diastology LVIDs:         3.20 cm      LV e' medial:    10.10 cm/s LV PW:         1.00 cm      LV E/e' medial:  7.9 LV IVS:        1.00 cm      LV e' lateral:   11.30 cm/s LVOT diam:     2.00 cm      LV E/e' lateral: 7.1 LV SV:         65 LV SV Index:   32           2D Longitudinal Strain LVOT Area:     3.14 cm     2D Strain GLS Avg:     -20.0 %  LV Volumes (MOD) LV vol d, MOD A2C: 110.0 ml 3D Volume EF: LV vol d, MOD A4C: 83.0 ml  3D EF:        54 % LV vol s, MOD A2C: 49.0 ml  LV EDV:        135 ml LV vol s, MOD A4C: 36.4 ml  LV ESV:       63 ml LV SV MOD A2C:     61.0 ml  LV SV:        73 ml LV SV MOD A4C:     83.0 ml LV SV MOD BP:      53.4 ml  RIGHT VENTRICLE RV S prime:  13.80 cm/s TAPSE (M-mode): 2.2 cm  LEFT ATRIUM             Index        RIGHT ATRIUM           Index LA diam:        4.10 cm 2.03 cm/m   RA Area:     12.90 cm LA Vol (A2C):   45.0 ml 22.26 ml/m  RA Volume:   28.00 ml  13.85 ml/m LA Vol (A4C):   50.2 ml 24.83 ml/m LA Biplane Vol: 47.7 ml 23.60 ml/m AORTIC VALVE             PULMONIC VALVE LVOT Vmax:   99.00 cm/s  PR End Diast Vel: 2.84 msec LVOT Vmean:  66.600 cm/s LVOT VTI:    0.206 m  AORTA Ao Root diam: 3.50 cm Ao Asc diam:  3.30 cm  MITRAL VALVE               TRICUSPID VALVE MV Area (PHT): 4.31 cm    TR Peak grad:   20.4 mmHg MV Decel Time: 176 msec    TR Vmax:        226.00 cm/s MV E velocity: 79.70 cm/s MV A velocity: 70.70 cm/s  SHUNTS MV E/A ratio:  1.13        Systemic VTI:  0.21 m Systemic Diam: 2.00 cm  Carolan Clines Electronically signed by Carolan Clines Signature Date/Time: 04/27/2022/5:53:04 PM    Final    MONITORS  CARDIAC EVENT MONITOR 05/08/2012   CT SCANS  CT CARDIAC SCORING (SELF PAY ONLY) 03/07/2022  Addendum 03/08/2022 10:14 AM ADDENDUM REPORT: 03/08/2022 10:12  EXAM: OVER-READ INTERPRETATION  CT CHEST  The following report is an over-read performed by radiologist Dr. Noe Gens [facility] on 03/08/2022. This over-read does not include interpretation of cardiac or coronary anatomy or pathology. The coronary calcium score interpretation by the cardiologist is attached.  COMPARISON:  None.  FINDINGS: Heart is normal size. Aorta normal caliber. No adenopathy. No confluent airspace opacities or effusions. No acute findings in the upper abdomen. Chest wall soft tissues are unremarkable. No acute bony abnormality.  IMPRESSION: No acute or significant extracardiac  abnormality.   Electronically Signed By: Charlett Nose M.D. On: 03/08/2022 10:12  Narrative CLINICAL DATA:  Cardiovascular Disease Risk stratification  EXAM: Coronary Calcium Score  TECHNIQUE: A gated, non-contrast computed tomography scan of the heart was performed using 3mm slice thickness. Axial images were analyzed on a dedicated workstation. Calcium scoring of the coronary arteries was performed using the Agatston method.  FINDINGS: Coronary Calcium Score:  Left main: 0  Left anterior descending artery: 227  Left circumflex artery: 152  Right coronary artery: 7.6  Total: 387  Percentile: 99th  Pericardium: Normal.  Non-cardiac: See separate report from [facility].  IMPRESSION: 1. Coronary calcium score of 387. This was 99th percentile for age-, race-, and sex-matched controls.  RECOMMENDATIONS: Coronary artery calcium (CAC) score is a strong predictor of incident coronary heart disease (CHD) and provides predictive information beyond traditional risk factors. CAC scoring is reasonable to use in the decision to withhold, postpone, or initiate statin therapy in intermediate-risk or selected borderline-risk asymptomatic adults (age 14-75 years and LDL-C >=70 to <190 mg/dL) who do not have diabetes or established atherosclerotic cardiovascular disease (ASCVD).* In intermediate-risk (10-year ASCVD risk >=7.5% to <20%) adults or selected borderline-risk (10-year ASCVD risk >=5% to <7.5%) adults in whom a CAC score is measured for the  purpose of making a treatment decision the following recommendations have been made:  If CAC=0, it is reasonable to withhold statin therapy and reassess in 5 to 10 years, as long as higher risk conditions are absent (diabetes mellitus, family history of premature CHD in first degree relatives (males <55 years; females <65 years), cigarette smoking, or LDL >=190 mg/dL).  If CAC is 1 to 99, it is reasonable to initiate  statin therapy for patients >=16 years of age.  If CAC is >=100 or >=75th percentile, it is reasonable to initiate statin therapy at any age.  Cardiology referral should be considered for patients with CAC scores >=400 or >=75th percentile.  *2018 AHA/ACC/AACVPR/AAPA/ABC/ACPM/ADA/AGS/APhA/ASPC/NLA/PCNA Guideline on the Management of Blood Cholesterol: A Report of the American College of Cardiology/American Heart Association Task Force on Clinical Practice Guidelines. J Am Coll Cardiol. 2019;73(24):3168-3209.  Lennie Odor, MD  Electronically Signed: By: Lennie Odor M.D. On: 03/07/2022 16:53     ______________________________________________________________________________________________     Recent Labs: 07/26/2022: ALT 27; Hemoglobin 14.3; Platelets 259  Recent Lipid Panel    Component Value Date/Time   CHOL 116 07/26/2022 0904   TRIG 91 07/26/2022 0904   HDL 35 (L) 07/26/2022 0904   CHOLHDL 3.3 07/26/2022 0904   LDLCALC 63 07/26/2022 0904    History of Present Illness    48 year old male with the above past medical history including elevated coronary calcium score, hyperlipidemia, hypothyroidism, OSA, and GERD.  Coronary artery calcium score in 02/2022 was 387 (99th percentile).  ETT in 04/2022 was negative for ischemia. Echocardiogram at the time showed normal LV function, EF 60 to 65%, no RWMA, no significant valvular abnormalities.  He was last seen in the office on 06/04/2022 and was doing well.  He presents today for follow-up.  Since his last visit he has been stable from cardiac standpoint.  He denies symptoms concerning for angina.  BP mildly elevated in office today. He does not check his blood pressure at home.  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  Overall, he reports feeling well.  Home Medications    Current Outpatient Medications  Medication Sig Dispense Refill   acetaminophen (TYLENOL) 325 MG tablet 325 mg every 4 (four) hours as  needed for mild pain.     albuterol (VENTOLIN HFA) 108 (90 Base) MCG/ACT inhaler INHALE 1 TO 2 PUFFS EVERY 4 HOURS AS NEEDED     atorvastatin (LIPITOR) 40 MG tablet TAKE 1 TABLET BY MOUTH EVERY DAY 90 tablet 3   clopidogrel (PLAVIX) 75 MG tablet TAKE 1 TABLET BY MOUTH EVERY DAY 90 tablet 0   EPINEPHrine 0.3 mg/0.3 mL IJ SOAJ injection INJECT 0.3 ML INTO THIGH ONCE FOR SEVERE ALLERGY REACTION     gentamicin ointment (GARAMYCIN) 0.1 % Apply 1 Application topically 3 (three) times daily.     levocetirizine (XYZAL) 5 MG tablet Take 2.5 mg by mouth every evening.     levothyroxine (SYNTHROID, LEVOTHROID) 100 MCG tablet TK 1 T PO  QD  4   mometasone (NASONEX) 50 MCG/ACT nasal spray Place 2 sprays into the nose daily.     Omega-3 Fatty Acids (FISH OIL) 1000 MG CAPS 2 capsules Orally Once a day     hydrOXYzine (ATARAX) 50 MG tablet Take 50 mg by mouth 3 (three) times daily as needed for anxiety. (Patient not taking: Reported on 05/06/2023)     Iron-Vitamin C (VITRON-C) 65-125 MG TABS Take by mouth. (Patient not taking: Reported on 05/06/2023)  meloxicam (MOBIC) 7.5 MG tablet Take 1 tablet (7.5 mg total) by mouth 2 (two) times daily as needed for pain. (Patient not taking: Reported on 05/06/2023) 30 tablet 3   sertraline (ZOLOFT) 25 MG tablet Take 12.5 mg by mouth daily. (Patient not taking: Reported on 05/06/2023)     No current facility-administered medications for this visit.     Review of Systems    He denies chest pain, palpitations, dyspnea, pnd, orthopnea, n, v, dizziness, syncope, edema, weight gain, or early satiety. All other systems reviewed and are otherwise negative except as noted above.   Physical Exam    VS:  BP (!) 128/90   Pulse 74   Ht 5\' 9"  (1.753 m)   Wt 200 lb (90.7 kg)   SpO2 97%   BMI 29.53 kg/m  GEN: Well nourished, well developed, in no acute distress. HEENT: normal. Neck: Supple, no JVD, carotid bruits, or masses. Cardiac: RRR, no murmurs, rubs, or gallops. No  clubbing, cyanosis, edema.  Radials/DP/PT 2+ and equal bilaterally.  Respiratory:  Respirations regular and unlabored, clear to auscultation bilaterally. GI: Soft, nontender, nondistended, BS + x 4. MS: no deformity or atrophy. Skin: warm and dry, no rash. Neuro:  Strength and sensation are intact. Psych: Normal affect.  Accessory Clinical Findings    ECG personally reviewed by me today - EKG Interpretation Date/Time:  Monday May 06 2023 11:03:12 EDT Ventricular Rate:  74 PR Interval:  160 QRS Duration:  90 QT Interval:  384 QTC Calculation: 426 R Axis:   13  Text Interpretation: Normal sinus rhythm Normal ECG When compared with ECG of 23-Apr-2022 15:59, Vent. rate has decreased BY  83 BPM Criteria for Septal infarct are no longer Present ST no longer depressed in Anterior leads Confirmed by Bernadene Person ([id number]) on 05/06/2023 11:23:11 AM  - no acute changes.   Lab Results  Component Value Date   WBC 5.5 07/26/2022   HGB 14.3 07/26/2022   HCT 41.1 07/26/2022   MCV 96 07/26/2022   PLT 259 07/26/2022   No results found for: "CREATININE", "BUN", "NA", "K", "CL", "CO2" Lab Results  Component Value Date   ALT 27 07/26/2022   AST 26 07/26/2022   ALKPHOS 72 07/26/2022   BILITOT 0.4 07/26/2022   Lab Results  Component Value Date   CHOL 116 07/26/2022   HDL 35 (L) 07/26/2022   LDLCALC 63 07/26/2022   TRIG 91 07/26/2022   CHOLHDL 3.3 07/26/2022    No results found for: "HGBA1C"  Assessment & Plan   1. Coronary artery calcification/elevated BP reading: Coronary artery calcium score in 02/2022 was 387 (99th percentile).  ETT in 04/2022 was negative for ischemia. Echo at the time showed normal LV function, EF 60 to 65%, no RWMA, no significant valvular abnormalities. Stable with no anginal symptoms. No indication for ischemic evaluation. DBP mildly elevated in office today. Advised him to purchase a blood pressure cuff and continue monitor BP, report BP consistently > 130/80.   Continue Plavix, Lipitor.  2. Hyperlipidemia: LDL was 67 in 12/2022, controlled. Continue Lipitor.    3. OSA: Reports adherence to CPAP, follows with neurology.    4. Disposition: Follow-up in 1 year.   HYPERTENSION CONTROL Vitals:   05/06/23 1107 05/06/23 1136  BP: (!) 120/92 (!) 128/90    The patient's blood pressure is elevated above target today.  In order to address the patient's elevated BP: Blood pressure will be monitored at home to determine if medication changes need to  be made.; Follow up with general cardiology has been recommended.; Follow up with primary care provider for management.      Joylene Grapes, NP 05/06/2023, 6:53 PM

## 2023-05-09 DIAGNOSIS — J301 Allergic rhinitis due to pollen: Secondary | ICD-10-CM | POA: Diagnosis not present

## 2023-05-09 DIAGNOSIS — M5412 Radiculopathy, cervical region: Secondary | ICD-10-CM | POA: Diagnosis not present

## 2023-05-18 ENCOUNTER — Other Ambulatory Visit: Payer: Self-pay | Admitting: Cardiovascular Disease

## 2023-05-21 DIAGNOSIS — J324 Chronic pansinusitis: Secondary | ICD-10-CM | POA: Diagnosis not present

## 2023-05-23 DIAGNOSIS — M5412 Radiculopathy, cervical region: Secondary | ICD-10-CM | POA: Diagnosis not present

## 2023-05-23 DIAGNOSIS — J301 Allergic rhinitis due to pollen: Secondary | ICD-10-CM | POA: Diagnosis not present

## 2023-05-28 DIAGNOSIS — M5412 Radiculopathy, cervical region: Secondary | ICD-10-CM | POA: Diagnosis not present

## 2023-05-30 DIAGNOSIS — J301 Allergic rhinitis due to pollen: Secondary | ICD-10-CM | POA: Diagnosis not present

## 2023-05-30 DIAGNOSIS — M5412 Radiculopathy, cervical region: Secondary | ICD-10-CM | POA: Diagnosis not present

## 2023-06-04 DIAGNOSIS — M5412 Radiculopathy, cervical region: Secondary | ICD-10-CM | POA: Diagnosis not present

## 2023-06-06 DIAGNOSIS — J301 Allergic rhinitis due to pollen: Secondary | ICD-10-CM | POA: Diagnosis not present

## 2023-06-13 DIAGNOSIS — J301 Allergic rhinitis due to pollen: Secondary | ICD-10-CM | POA: Diagnosis not present

## 2023-06-19 DIAGNOSIS — M5412 Radiculopathy, cervical region: Secondary | ICD-10-CM | POA: Diagnosis not present

## 2023-06-20 DIAGNOSIS — J301 Allergic rhinitis due to pollen: Secondary | ICD-10-CM | POA: Diagnosis not present

## 2023-06-25 DIAGNOSIS — M5412 Radiculopathy, cervical region: Secondary | ICD-10-CM | POA: Diagnosis not present

## 2023-06-27 DIAGNOSIS — J301 Allergic rhinitis due to pollen: Secondary | ICD-10-CM | POA: Diagnosis not present

## 2023-07-04 DIAGNOSIS — J301 Allergic rhinitis due to pollen: Secondary | ICD-10-CM | POA: Diagnosis not present

## 2023-07-09 DIAGNOSIS — J301 Allergic rhinitis due to pollen: Secondary | ICD-10-CM | POA: Diagnosis not present

## 2023-07-11 DIAGNOSIS — J301 Allergic rhinitis due to pollen: Secondary | ICD-10-CM | POA: Diagnosis not present

## 2023-07-18 DIAGNOSIS — J301 Allergic rhinitis due to pollen: Secondary | ICD-10-CM | POA: Diagnosis not present

## 2023-07-25 DIAGNOSIS — J301 Allergic rhinitis due to pollen: Secondary | ICD-10-CM | POA: Diagnosis not present

## 2023-08-01 DIAGNOSIS — J301 Allergic rhinitis due to pollen: Secondary | ICD-10-CM | POA: Diagnosis not present

## 2023-08-07 DIAGNOSIS — L814 Other melanin hyperpigmentation: Secondary | ICD-10-CM | POA: Diagnosis not present

## 2023-08-07 DIAGNOSIS — L821 Other seborrheic keratosis: Secondary | ICD-10-CM | POA: Diagnosis not present

## 2023-08-07 DIAGNOSIS — D225 Melanocytic nevi of trunk: Secondary | ICD-10-CM | POA: Diagnosis not present

## 2023-08-08 DIAGNOSIS — J301 Allergic rhinitis due to pollen: Secondary | ICD-10-CM | POA: Diagnosis not present

## 2023-08-11 ENCOUNTER — Other Ambulatory Visit: Payer: Self-pay | Admitting: Student

## 2023-08-15 DIAGNOSIS — J301 Allergic rhinitis due to pollen: Secondary | ICD-10-CM | POA: Diagnosis not present

## 2023-08-22 DIAGNOSIS — J301 Allergic rhinitis due to pollen: Secondary | ICD-10-CM | POA: Diagnosis not present

## 2023-08-29 DIAGNOSIS — J301 Allergic rhinitis due to pollen: Secondary | ICD-10-CM | POA: Diagnosis not present

## 2023-09-05 DIAGNOSIS — J301 Allergic rhinitis due to pollen: Secondary | ICD-10-CM | POA: Diagnosis not present

## 2023-09-12 DIAGNOSIS — J301 Allergic rhinitis due to pollen: Secondary | ICD-10-CM | POA: Diagnosis not present

## 2023-09-19 DIAGNOSIS — J301 Allergic rhinitis due to pollen: Secondary | ICD-10-CM | POA: Diagnosis not present

## 2023-09-25 DIAGNOSIS — J301 Allergic rhinitis due to pollen: Secondary | ICD-10-CM | POA: Diagnosis not present

## 2023-09-26 DIAGNOSIS — J301 Allergic rhinitis due to pollen: Secondary | ICD-10-CM | POA: Diagnosis not present

## 2023-10-03 DIAGNOSIS — J301 Allergic rhinitis due to pollen: Secondary | ICD-10-CM | POA: Diagnosis not present

## 2023-10-08 DIAGNOSIS — L820 Inflamed seborrheic keratosis: Secondary | ICD-10-CM | POA: Diagnosis not present

## 2023-10-08 DIAGNOSIS — D225 Melanocytic nevi of trunk: Secondary | ICD-10-CM | POA: Diagnosis not present

## 2023-10-08 DIAGNOSIS — D485 Neoplasm of uncertain behavior of skin: Secondary | ICD-10-CM | POA: Diagnosis not present

## 2023-10-10 DIAGNOSIS — J301 Allergic rhinitis due to pollen: Secondary | ICD-10-CM | POA: Diagnosis not present

## 2023-10-17 DIAGNOSIS — J301 Allergic rhinitis due to pollen: Secondary | ICD-10-CM | POA: Diagnosis not present

## 2023-10-24 DIAGNOSIS — J301 Allergic rhinitis due to pollen: Secondary | ICD-10-CM | POA: Diagnosis not present

## 2023-11-14 DIAGNOSIS — J301 Allergic rhinitis due to pollen: Secondary | ICD-10-CM | POA: Diagnosis not present

## 2023-11-21 DIAGNOSIS — J301 Allergic rhinitis due to pollen: Secondary | ICD-10-CM | POA: Diagnosis not present

## 2023-11-26 DIAGNOSIS — R400 Somnolence: Secondary | ICD-10-CM | POA: Diagnosis not present

## 2023-11-26 DIAGNOSIS — Z23 Encounter for immunization: Secondary | ICD-10-CM | POA: Diagnosis not present

## 2023-11-28 DIAGNOSIS — J301 Allergic rhinitis due to pollen: Secondary | ICD-10-CM | POA: Diagnosis not present

## 2023-12-05 DIAGNOSIS — J301 Allergic rhinitis due to pollen: Secondary | ICD-10-CM | POA: Diagnosis not present

## 2023-12-18 DIAGNOSIS — J301 Allergic rhinitis due to pollen: Secondary | ICD-10-CM | POA: Diagnosis not present

## 2023-12-19 DIAGNOSIS — J301 Allergic rhinitis due to pollen: Secondary | ICD-10-CM | POA: Diagnosis not present

## 2023-12-26 DIAGNOSIS — J301 Allergic rhinitis due to pollen: Secondary | ICD-10-CM | POA: Diagnosis not present

## 2024-01-02 DIAGNOSIS — J301 Allergic rhinitis due to pollen: Secondary | ICD-10-CM | POA: Diagnosis not present

## 2024-01-16 DIAGNOSIS — J301 Allergic rhinitis due to pollen: Secondary | ICD-10-CM | POA: Diagnosis not present

## 2024-01-23 DIAGNOSIS — J301 Allergic rhinitis due to pollen: Secondary | ICD-10-CM | POA: Diagnosis not present

## 2024-01-30 DIAGNOSIS — J301 Allergic rhinitis due to pollen: Secondary | ICD-10-CM | POA: Diagnosis not present
